# Patient Record
Sex: Female | Born: 1965 | Hispanic: No | Smoking: Never smoker
Health system: Southern US, Community
[De-identification: ages and names within clinical notes are randomized; demographics above are authoritative.]

## PROBLEM LIST (undated history)

## (undated) DIAGNOSIS — I7301 Raynaud's syndrome with gangrene: Secondary | ICD-10-CM

## (undated) HISTORY — DX: Raynaud's syndrome with gangrene: I73.01

## (undated) HISTORY — PX: APPENDECTOMY: SHX54

---

## 1998-10-30 HISTORY — PX: CERVICAL BIOPSY  W/ LOOP ELECTRODE EXCISION: SUR135

## 2005-12-01 ENCOUNTER — Emergency Department (HOSPITAL_COMMUNITY): Admission: EM | Admit: 2005-12-01 | Discharge: 2005-12-01 | Payer: Self-pay | Admitting: Emergency Medicine

## 2005-12-05 ENCOUNTER — Other Ambulatory Visit: Admission: RE | Admit: 2005-12-05 | Discharge: 2005-12-05 | Payer: Self-pay | Admitting: Obstetrics & Gynecology

## 2006-02-14 ENCOUNTER — Encounter: Admission: RE | Admit: 2006-02-14 | Discharge: 2006-02-14 | Payer: Self-pay | Admitting: *Deleted

## 2007-11-15 ENCOUNTER — Encounter (INDEPENDENT_AMBULATORY_CARE_PROVIDER_SITE_OTHER): Payer: Self-pay | Admitting: *Deleted

## 2007-11-15 ENCOUNTER — Ambulatory Visit (HOSPITAL_COMMUNITY): Admission: RE | Admit: 2007-11-15 | Discharge: 2007-11-15 | Payer: Self-pay | Admitting: *Deleted

## 2008-03-27 ENCOUNTER — Encounter: Admission: RE | Admit: 2008-03-27 | Discharge: 2008-03-27 | Payer: Self-pay | Admitting: Obstetrics & Gynecology

## 2010-11-20 ENCOUNTER — Encounter: Payer: Self-pay | Admitting: Otolaryngology

## 2011-03-14 NOTE — Op Note (Signed)
Morgan Flynn, Morgan Flynn        ACCOUNT NO.:  [id number]   MEDICAL RECORD NO.:  [id number]          PATIENT TYPE:  AMB   LOCATION:  ENDO                         FACILITY:  [facility]   PHYSICIAN:  Georgiana Spinner, M.D.    DATE OF BIRTH:  05/19/1966   DATE OF PROCEDURE:  11/15/2007  DATE OF DISCHARGE:                               OPERATIVE REPORT   PROCEDURE:  Upper endoscopy.   INDICATIONS:  Abdominal pain.   ANESTHESIA:  Fentanyl 50 mcg.   PROCEDURE IN DETAIL:  With the patient mildly sedated in the left  lateral decubitus position the Pentax videoscopic endoscope was inserted  in the mouth, passed under direct vision through the esophagus which  appeared normal above a small hiatal hernia.  There was no evidence of  Barrett's esophagus or esophagitis noted and photographs were taken.  We  entered into the stomach and a diffuse erythema was seen starting in the  cardia of the stomach fundus down to the body of the stomach where there  was a clear demarcation from the antrum which appeared normal.  Duodenal  bulb and second portion of duodenum were visualized and appeared normal  as well.  From this point the endoscope was slowly withdrawn taking  circumferential views of duodenal mucosa until the endoscope had been  pulled back and the stomach was placed in retroflexion to view the  stomach from below.  The endoscope was straightened and withdrawn taking  circumferential views of the remaining gastric and esophageal mucosa  stopping to biopsy from the body and fundus of the stomach, diffuse  erythematous changes were noted.  The patient's vital signs, pulse  oximeter remained stable.  The patient tolerated procedure well without  apparent complication.   FINDINGS:  Diffuse gastritis involving body, fundus and cardia of the  stomach with normal antrum, esophagus and duodenum.   PLAN:  Await biopsy report.  The patient will call me for results and  follow up with me as needed as an  outpatient.           ______________________________  Georgiana Spinner, M.D.     GMO/MEDQ  D:  11/15/2007  T:  11/15/2007  Job:  [id number]   cc:   Caryn Bee L. Little, M.D.  Fax: [phone]

## 2011-03-31 ENCOUNTER — Other Ambulatory Visit: Payer: Self-pay | Admitting: Obstetrics & Gynecology

## 2012-02-12 ENCOUNTER — Other Ambulatory Visit: Payer: Self-pay | Admitting: Obstetrics & Gynecology

## 2012-02-12 DIAGNOSIS — N6002 Solitary cyst of left breast: Secondary | ICD-10-CM

## 2012-02-20 ENCOUNTER — Other Ambulatory Visit: Payer: Self-pay

## 2012-02-27 ENCOUNTER — Other Ambulatory Visit: Payer: Self-pay

## 2012-03-15 ENCOUNTER — Ambulatory Visit
Admission: RE | Admit: 2012-03-15 | Discharge: 2012-03-15 | Disposition: A | Discharge status: Home / Self Care | Payer: BC Managed Care – PPO | Source: Ambulatory Visit | Attending: Obstetrics & Gynecology | Admitting: Obstetrics & Gynecology

## 2012-03-15 DIAGNOSIS — N6002 Solitary cyst of left breast: Secondary | ICD-10-CM

## 2012-03-29 ENCOUNTER — Other Ambulatory Visit: Payer: Self-pay

## 2012-04-29 ENCOUNTER — Other Ambulatory Visit: Payer: Self-pay

## 2013-06-17 ENCOUNTER — Other Ambulatory Visit: Payer: Self-pay | Admitting: Gastroenterology

## 2013-06-17 DIAGNOSIS — R1011 Right upper quadrant pain: Secondary | ICD-10-CM

## 2013-06-23 ENCOUNTER — Other Ambulatory Visit: Payer: BC Managed Care – PPO

## 2013-07-02 ENCOUNTER — Ambulatory Visit
Admission: RE | Admit: 2013-07-02 | Discharge: 2013-07-02 | Disposition: A | Discharge status: Home / Self Care | Payer: BC Managed Care – PPO | Source: Ambulatory Visit | Attending: Gastroenterology | Admitting: Gastroenterology

## 2013-07-02 DIAGNOSIS — R1011 Right upper quadrant pain: Secondary | ICD-10-CM

## 2015-04-15 ENCOUNTER — Other Ambulatory Visit: Payer: Self-pay

## 2015-04-16 LAB — CYTOLOGY - PAP

## 2016-03-17 DIAGNOSIS — R079 Chest pain, unspecified: Secondary | ICD-10-CM | POA: Diagnosis not present

## 2016-03-17 DIAGNOSIS — R609 Edema, unspecified: Secondary | ICD-10-CM | POA: Diagnosis not present

## 2016-04-07 DIAGNOSIS — R079 Chest pain, unspecified: Secondary | ICD-10-CM | POA: Diagnosis not present

## 2016-04-07 DIAGNOSIS — M79643 Pain in unspecified hand: Secondary | ICD-10-CM | POA: Diagnosis not present

## 2016-04-07 DIAGNOSIS — M7989 Other specified soft tissue disorders: Secondary | ICD-10-CM | POA: Diagnosis not present

## 2016-04-07 DIAGNOSIS — D71 Functional disorders of polymorphonuclear neutrophils: Secondary | ICD-10-CM | POA: Diagnosis not present

## 2016-04-07 DIAGNOSIS — R0789 Other chest pain: Secondary | ICD-10-CM | POA: Diagnosis not present

## 2016-04-07 DIAGNOSIS — R0602 Shortness of breath: Secondary | ICD-10-CM | POA: Diagnosis not present

## 2016-04-13 DIAGNOSIS — R0789 Other chest pain: Secondary | ICD-10-CM | POA: Diagnosis not present

## 2016-04-13 DIAGNOSIS — M79643 Pain in unspecified hand: Secondary | ICD-10-CM | POA: Diagnosis not present

## 2016-04-13 DIAGNOSIS — M7989 Other specified soft tissue disorders: Secondary | ICD-10-CM | POA: Diagnosis not present

## 2016-04-26 DIAGNOSIS — Z01419 Encounter for gynecological examination (general) (routine) without abnormal findings: Secondary | ICD-10-CM | POA: Diagnosis not present

## 2016-04-26 DIAGNOSIS — Z6821 Body mass index (BMI) 21.0-21.9, adult: Secondary | ICD-10-CM | POA: Diagnosis not present

## 2016-04-26 DIAGNOSIS — Z1231 Encounter for screening mammogram for malignant neoplasm of breast: Secondary | ICD-10-CM | POA: Diagnosis not present

## 2016-04-28 DIAGNOSIS — R0789 Other chest pain: Secondary | ICD-10-CM | POA: Diagnosis not present

## 2016-05-04 ENCOUNTER — Other Ambulatory Visit: Payer: Self-pay | Admitting: Obstetrics & Gynecology

## 2016-05-04 DIAGNOSIS — R928 Other abnormal and inconclusive findings on diagnostic imaging of breast: Secondary | ICD-10-CM

## 2016-05-05 DIAGNOSIS — R0602 Shortness of breath: Secondary | ICD-10-CM | POA: Diagnosis not present

## 2016-05-05 DIAGNOSIS — R0789 Other chest pain: Secondary | ICD-10-CM | POA: Diagnosis not present

## 2016-05-05 DIAGNOSIS — M7989 Other specified soft tissue disorders: Secondary | ICD-10-CM | POA: Diagnosis not present

## 2016-05-05 DIAGNOSIS — M79643 Pain in unspecified hand: Secondary | ICD-10-CM | POA: Diagnosis not present

## 2016-05-09 ENCOUNTER — Ambulatory Visit
Admission: RE | Admit: 2016-05-09 | Discharge: 2016-05-09 | Disposition: A | Discharge status: Home / Self Care | Payer: BLUE CROSS/BLUE SHIELD | Source: Ambulatory Visit | Attending: Obstetrics & Gynecology | Admitting: Obstetrics & Gynecology

## 2016-05-09 DIAGNOSIS — R922 Inconclusive mammogram: Secondary | ICD-10-CM | POA: Diagnosis not present

## 2016-05-09 DIAGNOSIS — R928 Other abnormal and inconclusive findings on diagnostic imaging of breast: Secondary | ICD-10-CM

## 2016-06-13 DIAGNOSIS — I7300 Raynaud's syndrome without gangrene: Secondary | ICD-10-CM | POA: Diagnosis not present

## 2016-06-13 DIAGNOSIS — M79641 Pain in right hand: Secondary | ICD-10-CM | POA: Diagnosis not present

## 2016-06-13 DIAGNOSIS — M25561 Pain in right knee: Secondary | ICD-10-CM | POA: Diagnosis not present

## 2016-06-13 DIAGNOSIS — R5381 Other malaise: Secondary | ICD-10-CM | POA: Diagnosis not present

## 2016-06-13 DIAGNOSIS — R300 Dysuria: Secondary | ICD-10-CM | POA: Diagnosis not present

## 2016-06-13 DIAGNOSIS — M79642 Pain in left hand: Secondary | ICD-10-CM | POA: Diagnosis not present

## 2016-06-13 DIAGNOSIS — M2550 Pain in unspecified joint: Secondary | ICD-10-CM | POA: Diagnosis not present

## 2016-06-13 DIAGNOSIS — Z79899 Other long term (current) drug therapy: Secondary | ICD-10-CM | POA: Diagnosis not present

## 2016-07-27 DIAGNOSIS — M3509 Sicca syndrome with other organ involvement: Secondary | ICD-10-CM | POA: Diagnosis not present

## 2016-07-27 DIAGNOSIS — I7300 Raynaud's syndrome without gangrene: Secondary | ICD-10-CM | POA: Diagnosis not present

## 2016-07-27 DIAGNOSIS — M19241 Secondary osteoarthritis, right hand: Secondary | ICD-10-CM | POA: Diagnosis not present

## 2016-07-27 DIAGNOSIS — M170 Bilateral primary osteoarthritis of knee: Secondary | ICD-10-CM | POA: Diagnosis not present

## 2016-10-27 DIAGNOSIS — Z1211 Encounter for screening for malignant neoplasm of colon: Secondary | ICD-10-CM | POA: Diagnosis not present

## 2016-10-27 DIAGNOSIS — K640 First degree hemorrhoids: Secondary | ICD-10-CM | POA: Diagnosis not present

## 2016-12-15 DIAGNOSIS — Z803 Family history of malignant neoplasm of breast: Secondary | ICD-10-CM | POA: Diagnosis not present

## 2016-12-15 DIAGNOSIS — R7301 Impaired fasting glucose: Secondary | ICD-10-CM | POA: Diagnosis not present

## 2016-12-15 DIAGNOSIS — Z0000 Encounter for general adult medical examination without abnormal findings: Secondary | ICD-10-CM | POA: Diagnosis not present

## 2016-12-15 DIAGNOSIS — J309 Allergic rhinitis, unspecified: Secondary | ICD-10-CM | POA: Diagnosis not present

## 2016-12-22 DIAGNOSIS — Z0000 Encounter for general adult medical examination without abnormal findings: Secondary | ICD-10-CM | POA: Diagnosis not present

## 2017-05-23 DIAGNOSIS — Z3202 Encounter for pregnancy test, result negative: Secondary | ICD-10-CM | POA: Diagnosis not present

## 2017-05-23 DIAGNOSIS — Z01419 Encounter for gynecological examination (general) (routine) without abnormal findings: Secondary | ICD-10-CM | POA: Diagnosis not present

## 2017-05-23 DIAGNOSIS — Z1231 Encounter for screening mammogram for malignant neoplasm of breast: Secondary | ICD-10-CM | POA: Diagnosis not present

## 2017-05-23 DIAGNOSIS — Z202 Contact with and (suspected) exposure to infections with a predominantly sexual mode of transmission: Secondary | ICD-10-CM | POA: Diagnosis not present

## 2017-05-23 DIAGNOSIS — Z6821 Body mass index (BMI) 21.0-21.9, adult: Secondary | ICD-10-CM | POA: Diagnosis not present

## 2017-09-06 DIAGNOSIS — B029 Zoster without complications: Secondary | ICD-10-CM | POA: Diagnosis not present

## 2017-09-06 DIAGNOSIS — B020 Zoster encephalitis: Secondary | ICD-10-CM | POA: Diagnosis not present

## 2017-09-13 DIAGNOSIS — R0781 Pleurodynia: Secondary | ICD-10-CM | POA: Diagnosis not present

## 2017-12-17 DIAGNOSIS — Z0000 Encounter for general adult medical examination without abnormal findings: Secondary | ICD-10-CM | POA: Diagnosis not present

## 2017-12-21 DIAGNOSIS — Z0000 Encounter for general adult medical examination without abnormal findings: Secondary | ICD-10-CM | POA: Diagnosis not present

## 2018-05-29 DIAGNOSIS — N912 Amenorrhea, unspecified: Secondary | ICD-10-CM | POA: Diagnosis not present

## 2018-05-29 DIAGNOSIS — Z6822 Body mass index (BMI) 22.0-22.9, adult: Secondary | ICD-10-CM | POA: Diagnosis not present

## 2018-05-29 DIAGNOSIS — Z01419 Encounter for gynecological examination (general) (routine) without abnormal findings: Secondary | ICD-10-CM | POA: Diagnosis not present

## 2018-05-29 DIAGNOSIS — Z124 Encounter for screening for malignant neoplasm of cervix: Secondary | ICD-10-CM | POA: Diagnosis not present

## 2018-05-29 DIAGNOSIS — N841 Polyp of cervix uteri: Secondary | ICD-10-CM | POA: Diagnosis not present

## 2018-06-07 DIAGNOSIS — Z6822 Body mass index (BMI) 22.0-22.9, adult: Secondary | ICD-10-CM | POA: Diagnosis not present

## 2018-06-07 DIAGNOSIS — Z1231 Encounter for screening mammogram for malignant neoplasm of breast: Secondary | ICD-10-CM | POA: Diagnosis not present

## 2018-12-16 DIAGNOSIS — J0100 Acute maxillary sinusitis, unspecified: Secondary | ICD-10-CM | POA: Diagnosis not present

## 2018-12-25 DIAGNOSIS — Z0000 Encounter for general adult medical examination without abnormal findings: Secondary | ICD-10-CM | POA: Diagnosis not present

## 2018-12-25 DIAGNOSIS — R7301 Impaired fasting glucose: Secondary | ICD-10-CM | POA: Diagnosis not present

## 2018-12-25 DIAGNOSIS — Z803 Family history of malignant neoplasm of breast: Secondary | ICD-10-CM | POA: Diagnosis not present

## 2018-12-25 DIAGNOSIS — J309 Allergic rhinitis, unspecified: Secondary | ICD-10-CM | POA: Diagnosis not present

## 2018-12-27 DIAGNOSIS — Z0000 Encounter for general adult medical examination without abnormal findings: Secondary | ICD-10-CM | POA: Diagnosis not present

## 2018-12-27 DIAGNOSIS — R7301 Impaired fasting glucose: Secondary | ICD-10-CM | POA: Diagnosis not present

## 2019-04-09 DIAGNOSIS — D2261 Melanocytic nevi of right upper limb, including shoulder: Secondary | ICD-10-CM | POA: Diagnosis not present

## 2019-04-09 DIAGNOSIS — D235 Other benign neoplasm of skin of trunk: Secondary | ICD-10-CM | POA: Diagnosis not present

## 2019-04-09 DIAGNOSIS — D225 Melanocytic nevi of trunk: Secondary | ICD-10-CM | POA: Diagnosis not present

## 2019-04-09 DIAGNOSIS — D2262 Melanocytic nevi of left upper limb, including shoulder: Secondary | ICD-10-CM | POA: Diagnosis not present

## 2019-05-06 ENCOUNTER — Other Ambulatory Visit: Payer: Self-pay | Admitting: Obstetrics and Gynecology

## 2019-05-06 DIAGNOSIS — N644 Mastodynia: Secondary | ICD-10-CM

## 2019-06-02 ENCOUNTER — Other Ambulatory Visit: Payer: Self-pay

## 2019-06-02 ENCOUNTER — Ambulatory Visit
Admission: RE | Admit: 2019-06-02 | Discharge: 2019-06-02 | Disposition: A | Discharge status: Home / Self Care | Payer: Self-pay | Source: Ambulatory Visit | Attending: Obstetrics and Gynecology | Admitting: Obstetrics and Gynecology

## 2019-06-02 ENCOUNTER — Ambulatory Visit
Admission: RE | Admit: 2019-06-02 | Discharge: 2019-06-02 | Disposition: A | Discharge status: Home / Self Care | Payer: BLUE CROSS/BLUE SHIELD | Source: Ambulatory Visit | Attending: Obstetrics and Gynecology | Admitting: Obstetrics and Gynecology

## 2019-06-02 DIAGNOSIS — N644 Mastodynia: Secondary | ICD-10-CM

## 2019-06-02 DIAGNOSIS — R922 Inconclusive mammogram: Secondary | ICD-10-CM | POA: Diagnosis not present

## 2019-06-09 DIAGNOSIS — Z01419 Encounter for gynecological examination (general) (routine) without abnormal findings: Secondary | ICD-10-CM | POA: Diagnosis not present

## 2019-06-09 DIAGNOSIS — Z6822 Body mass index (BMI) 22.0-22.9, adult: Secondary | ICD-10-CM | POA: Diagnosis not present

## 2019-06-30 NOTE — Progress Notes (Signed)
Office Visit Note  Patient: Morgan Flynn             Date of Birth: 12/22/1965           MRN: [id number]             PCP: Catha GosselinLittle, Kevin, MD Referring: No ref. provider found Visit Date: 07/09/2019 Occupation: @GUAROCC @  Subjective:  Joint stiffness and Raynauds.    History of Present Illness: Morgan ShipMargarita Macpherson is a 53 y.o. female returns today after her last visit in September 2017 at that time she was seen for evaluation of Raynauds and sicca symptoms.  According to patient she went for a viral tumor and came back and her symptoms exacerbated.  She had a history of Raynaud's phenomenon for many years.  She states recently she has been experiencing puffiness on her face when she gets up in the morning.  She is also noticed swelling in her legs.  She denies any joint pain but she has a stiffness in her hands, knees and her feet.  She also has some lower back pain.  Her sicca symptoms are not as severe.  She denies any history of rash.  Patient states that 25 years ago while she was pregnant she developed swelling in her right foot.  Since then she has persistent swelling in her right foot and her right knee.  She states her symptoms get worse during the summertime.  She believes she has lymphedema in her right lower extremity.  Patient states that she has had x-rays of her knees and feet recently and they were consistent with some arthritis.  Activities of Daily Living:  Patient reports morning stiffness for 15 minutes.   Patient Denies nocturnal pain.  Difficulty dressing/grooming: Denies Difficulty climbing stairs: Denies Difficulty getting out of chair: Denies Difficulty using hands for taps, buttons, cutlery, and/or writing: Denies  Review of Systems  Constitutional: Negative for fatigue, night sweats, weight gain and weight loss.  HENT: Negative for mouth sores, trouble swallowing, trouble swallowing, mouth dryness and nose dryness.   Eyes: Negative for pain, redness,  itching, visual disturbance and dryness.  Respiratory: Negative for cough, shortness of breath, wheezing and difficulty breathing.   Cardiovascular: Negative for chest pain, palpitations, hypertension, irregular heartbeat and swelling in legs/feet.  Gastrointestinal: Negative for blood in stool, constipation and diarrhea.  Endocrine: Negative for increased urination.  Genitourinary: Negative for difficulty urinating, painful urination and vaginal dryness.  Musculoskeletal: Positive for joint swelling and morning stiffness. Negative for arthralgias, joint pain, myalgias, muscle weakness, muscle tenderness and myalgias.  Skin: Positive for color change. Negative for rash, hair loss, skin tightness, ulcers and sensitivity to sunlight.  Allergic/Immunologic: Negative for susceptible to infections.  Neurological: Negative for dizziness, headaches, memory loss, night sweats and weakness.  Hematological: Negative for swollen glands.  Psychiatric/Behavioral: Negative for depressed mood, confusion and sleep disturbance. The patient is not nervous/anxious.     PMFS History:  Patient Active Problem List   Diagnosis Date Noted   Raynaud's syndrome without gangrene 07/09/2019   Primary osteoarthritis of both knees 07/09/2019    History reviewed. No pertinent past medical history.  Family History  Problem Relation Age of Onset   Breast cancer Sister    Ovarian cancer Sister    Heart Problems Mother    Healthy Son    Past Surgical History:  Procedure Laterality Date   APPENDECTOMY     age 53   CERVICAL BIOPSY  W/ LOOP ELECTRODE EXCISION  2000  Social History   Social History Narrative   Not on file    There is no immunization history on file for this patient.   Objective: Vital Signs: BP 114/76 (BP Location: Left Arm, Patient Position: Sitting, Cuff Size: Normal)    Pulse 67    Resp 12    Ht 5\' 5"  (1.651 m)    Wt 137 lb 9.6 oz (62.4 kg)    BMI 22.90 kg/m    Physical  Exam Vitals signs and nursing note reviewed.  Constitutional:      Appearance: She is well-developed.  HENT:     Head: Normocephalic and atraumatic.  Eyes:     Conjunctiva/sclera: Conjunctivae normal.  Neck:     Musculoskeletal: Normal range of motion.  Cardiovascular:     Rate and Rhythm: Normal rate and regular rhythm.     Heart sounds: Normal heart sounds.  Pulmonary:     Effort: Pulmonary effort is normal.     Breath sounds: Normal breath sounds.  Abdominal:     General: Bowel sounds are normal.     Palpations: Abdomen is soft.  Lymphadenopathy:     Cervical: No cervical adenopathy.  Skin:    General: Skin is warm and dry.     Capillary Refill: Capillary refill takes less than 2 seconds.  Neurological:     Mental Status: She is alert and oriented to person, place, and time.  Psychiatric:        Behavior: Behavior normal.      Musculoskeletal Exam: She has some stiffness of range of motion of her cervical spine.  Shoulder joints elbow joints wrist joint MCPs PIPs DIPs with good range of motion.  She has mild thickening of her bilateral first DIP joint.  She has good range of motion of her bilateral knee joints ankle joints hip joints.  Thickening of her right second toe was noted.  She states is been like this for many years.  Nonpitting edema was noted on the dorsum of her right foot and some on her right lower extremity.  CDAI Exam: CDAI Score: -- Patient Global: --; Provider Global: -- Swollen: --; Tender: -- Joint Exam   No joint exam has been documented for this visit   There is currently no information documented on the homunculus. Go to the Rheumatology activity and complete the homunculus joint exam.  Investigation: No additional findings.  Imaging: No results found.August 2017:  Her labs showed CBC, comprehensive metabolic panel, sed rate, CK, UA were normal.  Her PAN ANCA was negative.  C-ANCA was slightly positive at 1:40.  ANA was negative.  Complements  normal.  ENA showed positive La antibody.  Beta-2 anticardiolipin and lupus anticoagulant were negative.  CCP was negative and 14-3-3 eta was negative.  Cryoglobulins were negative and SPEP was normal.   Recent Labs: No results found for: WBC, HGB, PLT, NA, K, CL, CO2, GLUCOSE, BUN, CREATININE, BILITOT, ALKPHOS, AST, ALT, PROT, ALBUMIN, CALCIUM, GFRAA, QFTBGOLD, QFTBGOLDPLUS  Speciality Comments: No specialty comments available.  Procedures:  No procedures performed Allergies: Patient has no known allergies.   Assessment / Plan:     Visit Diagnoses: Raynaud's syndrome without gangrene -her Raynolds symptoms are more prominent during the wintertime.  She states she is not having much discomfort from Raynaud's.  Her labs in the past showed c-ANCA 1:40,  ENA showed +La antibody -per her request I will repeat ANCA levels.  Plan: Pan-ANCA  Primary osteoarthritis of both knees-I do not have x-rays available  of her knee joints.  According to patient her x-rays show osteoarthritis and she has ongoing stiffness and discomfort in her knee joints.  Sicca, unspecified type ([place]) - positive La antibody.   -Patient states that her sicca symptoms have improved over time.  She is currently not having much symptoms.  Although she would like to have repeat antibody testing.  Plan: Sjogrens syndrome-B extractable nuclear antibody, Sjogrens syndrome-A extractable nuclear antibody  Joint stiffness -she has been experiencing increasing stiffness in multiple joints which include her bilateral hands, bilateral knee joints and her feet.  To complete the work-up I will obtain following labs today.  I did not see any synovitis in her joints.  Her right second toe is thickened but no swelling or synovitis was noted.  She believes is been like this for 25 years.  Plan: Sedimentation rate, Rheumatoid factor, Cyclic citrul peptide antibody, IgG, 14-3-3 eta Protein, ANA, Uric acid  Other fatigue - Plan: CBC with  Differential/Platelet, COMPLETE METABOLIC PANEL WITH GFR  Lymphedema of right lower extremity-she has had nonpitting edema on her right lower extremity.  She states she has had work-up many years ago.  She believes her symptoms are started while she was pregnant 25 years ago.  She has not noticed any improvement in the symptoms.  Although she would like further evaluation.  I will refer her to vascular surgery.  History of hypertension-her blood pressure is well controlled.  Orders: Orders Placed This Encounter  Procedures   CBC with Differential/Platelet   COMPLETE METABOLIC PANEL WITH GFR   Sedimentation rate   Rheumatoid factor   Cyclic citrul peptide antibody, IgG   14-3-3 eta Protein   ANA   Sjogrens syndrome-B extractable nuclear antibody   Sjogrens syndrome-A extractable nuclear antibody   Pan-ANCA   Uric acid   Ambulatory referral to Vascular Surgery   No orders of the defined types were placed in this encounter.   Face-to-face time spent with patient was 40 minutes. Greater than 50% of time was spent in counseling and coordination of care.  Follow-Up Instructions: Return in about 1 month (around 08/08/2019) for Osteoarthritis, raynauds.   Bo Merino, MD  Note - This record has been created using Editor, commissioning.  Chart creation errors have been sought, but may not always  have been located. Such creation errors do not reflect on  the standard of medical care.

## 2019-07-09 ENCOUNTER — Encounter: Payer: Self-pay | Admitting: Rheumatology

## 2019-07-09 ENCOUNTER — Ambulatory Visit: Payer: Self-pay | Admitting: Rheumatology

## 2019-07-09 ENCOUNTER — Other Ambulatory Visit: Payer: Self-pay

## 2019-07-09 VITALS — BP 114/76 | HR 67 | Resp 12 | Ht 65.0 in | Wt 137.6 lb

## 2019-07-09 DIAGNOSIS — M3500 Sicca syndrome, unspecified: Secondary | ICD-10-CM | POA: Diagnosis not present

## 2019-07-09 DIAGNOSIS — M2560 Stiffness of unspecified joint, not elsewhere classified: Secondary | ICD-10-CM

## 2019-07-09 DIAGNOSIS — I7300 Raynaud's syndrome without gangrene: Secondary | ICD-10-CM | POA: Diagnosis not present

## 2019-07-09 DIAGNOSIS — R5383 Other fatigue: Secondary | ICD-10-CM | POA: Diagnosis not present

## 2019-07-09 DIAGNOSIS — I890 Lymphedema, not elsewhere classified: Secondary | ICD-10-CM

## 2019-07-09 DIAGNOSIS — Z8679 Personal history of other diseases of the circulatory system: Secondary | ICD-10-CM

## 2019-07-09 DIAGNOSIS — M170 Bilateral primary osteoarthritis of knee: Secondary | ICD-10-CM | POA: Diagnosis not present

## 2019-07-14 LAB — CBC WITH DIFFERENTIAL/PLATELET
Absolute Monocytes: 374 cells/uL (ref 200–950)
Basophils Absolute: 29 cells/uL (ref 0–200)
Basophils Relative: 0.7 %
Eosinophils Absolute: 42 cells/uL (ref 15–500)
Eosinophils Relative: 1 %
HCT: 40.4 % (ref 35.0–45.0)
Hemoglobin: 13.2 g/dL (ref 11.7–15.5)
Lymphs Abs: 1394 cells/uL (ref 850–3900)
MCH: 31.1 pg (ref 27.0–33.0)
MCHC: 32.7 g/dL (ref 32.0–36.0)
MCV: 95.3 fL (ref 80.0–100.0)
MPV: 11.5 fL (ref 7.5–12.5)
Monocytes Relative: 8.9 %
Neutro Abs: 2360 cells/uL (ref 1500–7800)
Neutrophils Relative %: 56.2 %
Platelets: 194 10*3/uL (ref 140–400)
RBC: 4.24 10*6/uL (ref 3.80–5.10)
RDW: 12.1 % (ref 11.0–15.0)
Total Lymphocyte: 33.2 %
WBC: 4.2 10*3/uL (ref 3.8–10.8)

## 2019-07-14 LAB — COMPLETE METABOLIC PANEL WITH GFR
AG Ratio: 1.8 (calc) (ref 1.0–2.5)
ALT: 13 U/L (ref 6–29)
AST: 18 U/L (ref 10–35)
Albumin: 4.6 g/dL (ref 3.6–5.1)
Alkaline phosphatase (APISO): 59 U/L (ref 37–153)
BUN: 13 mg/dL (ref 7–25)
CO2: 32 mmol/L (ref 20–32)
Calcium: 9.6 mg/dL (ref 8.6–10.4)
Chloride: 104 mmol/L (ref 98–110)
Creat: 0.64 mg/dL (ref 0.50–1.05)
GFR, Est African American: 117 mL/min/{1.73_m2} (ref >=60)
GFR, Est Non African American: 101 mL/min/{1.73_m2} (ref >=60)
Globulin: 2.5 g/dL (calc) (ref 1.9–3.7)
Glucose, Bld: 96 mg/dL (ref 65–99)
Potassium: 4.1 mmol/L (ref 3.5–5.3)
Sodium: 141 mmol/L (ref 135–146)
Total Bilirubin: 0.5 mg/dL (ref 0.2–1.2)
Total Protein: 7.1 g/dL (ref 6.1–8.1)

## 2019-07-14 LAB — URIC ACID: Uric Acid, Serum: 3.8 mg/dL (ref 2.5–7.0)

## 2019-07-14 LAB — ANA: Anti Nuclear Antibody (ANA): NEGATIVE

## 2019-07-14 LAB — RHEUMATOID FACTOR: Rhuematoid fact SerPl-aCnc: <14 IU/mL (ref <14)

## 2019-07-14 LAB — CYCLIC CITRUL PEPTIDE ANTIBODY, IGG: Cyclic Citrullin Peptide Ab: <16 UNITS

## 2019-07-14 LAB — PAN-ANCA
ANCA Screen: NEGATIVE
Myeloperoxidase Abs: <1 AI
Serine Protease 3: <1 AI

## 2019-07-14 LAB — SJOGRENS SYNDROME-A EXTRACTABLE NUCLEAR ANTIBODY: SSA (Ro) (ENA) Antibody, IgG: <1 AI

## 2019-07-14 LAB — SJOGRENS SYNDROME-B EXTRACTABLE NUCLEAR ANTIBODY: SSB (La) (ENA) Antibody, IgG: 2.2 AI — AB

## 2019-07-14 LAB — 14-3-3 ETA PROTEIN: 14-3-3 eta Protein: <0.2 ng/mL (ref <0.2)

## 2019-07-14 LAB — SEDIMENTATION RATE: Sed Rate: 2 mm/h (ref 0–30)

## 2019-07-14 NOTE — Progress Notes (Signed)
We will discuss results at the follow-up visit.

## 2019-07-16 ENCOUNTER — Ambulatory Visit: Payer: Self-pay | Admitting: Rheumatology

## 2019-07-24 NOTE — Progress Notes (Deleted)
   Office Visit Note  Patient: Morgan Flynn             Date of Birth: 1966-01-02           MRN: [id number]             PCP: Hulan Fess, MD Referring: Hulan Fess, MD Visit Date: 08/07/2019 Occupation: @GUAROCC @  Subjective:  No chief complaint on file.   History of Present Illness: Morgan Flynn is a 53 y.o. female ***   Activities of Daily Living:  Patient reports morning stiffness for *** {minute/hour:19697}.   Patient {ACTIONS;DENIES/REPORTS:21021675::"Denies"} nocturnal pain.  Difficulty dressing/grooming: {ACTIONS;DENIES/REPORTS:21021675::"Denies"} Difficulty climbing stairs: {ACTIONS;DENIES/REPORTS:21021675::"Denies"} Difficulty getting out of chair: {ACTIONS;DENIES/REPORTS:21021675::"Denies"} Difficulty using hands for taps, buttons, cutlery, and/or writing: {ACTIONS;DENIES/REPORTS:21021675::"Denies"}  No Rheumatology ROS completed.   PMFS History:  Patient Active Problem List   Diagnosis Date Noted  . Raynaud's syndrome without gangrene 07/09/2019  . Primary osteoarthritis of both knees 07/09/2019    No past medical history on file.  Family History  Problem Relation Age of Onset  . Breast cancer Sister   . Ovarian cancer Sister   . Heart Problems Mother   . Healthy Son    Past Surgical History:  Procedure Laterality Date  . APPENDECTOMY     age 67  . CERVICAL BIOPSY  W/ LOOP ELECTRODE EXCISION  2000   Social History   Social History Narrative  . Not on file    There is no immunization history on file for this patient.   Objective: Vital Signs: There were no vitals taken for this visit.   Physical Exam   Musculoskeletal Exam: ***  CDAI Exam: CDAI Score: - Patient Global: -; Provider Global: - Swollen: -; Tender: - Joint Exam   No joint exam has been documented for this visit   There is currently no information documented on the homunculus. Go to the Rheumatology activity and complete the homunculus joint exam.   Investigation: No additional findings.  Imaging: No results found.  Recent Labs: Lab Results  Component Value Date   WBC 4.2 07/09/2019   HGB 13.2 07/09/2019   PLT 194 07/09/2019   NA 141 07/09/2019   K 4.1 07/09/2019   CL 104 07/09/2019   CO2 32 07/09/2019   GLUCOSE 96 07/09/2019   BUN 13 07/09/2019   CREATININE 0.64 07/09/2019   BILITOT 0.5 07/09/2019   AST 18 07/09/2019   ALT 13 07/09/2019   PROT 7.1 07/09/2019   CALCIUM 9.6 07/09/2019   GFRAA 117 07/09/2019    Speciality Comments: No specialty comments available.  Procedures:  No procedures performed Allergies: Patient has no known allergies.   Assessment / Plan:     Visit Diagnoses: No diagnosis found.  Orders: No orders of the defined types were placed in this encounter.  No orders of the defined types were placed in this encounter.   Face-to-face time spent with patient was *** minutes. Greater than 50% of time was spent in counseling and coordination of care.  Follow-Up Instructions: No follow-ups on file.   Earnestine Mealing, CMA  Note - This record has been created using Editor, commissioning.  Chart creation errors have been sought, but may not always  have been located. Such creation errors do not reflect on  the standard of medical care.

## 2019-07-24 NOTE — Progress Notes (Signed)
Office Visit Note  Patient: Morgan Flynn             Date of Birth: 1965/12/05           MRN: [id number]             PCP: Hulan Fess, MD Referring: Hulan Fess, MD Visit Date: 08/07/2019 Occupation: '@GUAROCC' @  Subjective:  Pain in both knee joints   History of Present Illness: Morgan Flynn is a 53 y.o. female with history of Raynaud's, sicca symptoms, and osteoarthritis. She reports she has intermittent pain and swelling in both knee joints and both wrist joints.  She states the swelling in worse in the mornings.  She has chronic neck pain and stiffness.  She has been performing neck exercises on a regular basis.  She also uses a Production assistant, radio but has persistent discomfort. 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  She states has mild mouth dryness but no eye dryness.  She tries to drink a lot of fluids on a daily basis.     Activities of Daily Living:  Patient reports morning stiffness for 20 minutes.   Patient Denies nocturnal pain.  Difficulty dressing/grooming: Denies Difficulty climbing stairs: Denies Difficulty getting out of chair: Denies Difficulty using hands for taps, buttons, cutlery, and/or writing: Reports  Review of Systems  Constitutional: Negative for fatigue.  HENT: Negative for mouth sores, mouth dryness and nose dryness.   Eyes: Negative for pain, itching, visual disturbance and dryness.  Respiratory: Negative for cough, hemoptysis, shortness of breath, wheezing and difficulty breathing.   Cardiovascular: Negative for chest pain, palpitations, hypertension and swelling in legs/feet.  Gastrointestinal: Negative for abdominal pain, blood in stool, constipation and diarrhea.  Endocrine: Negative for increased urination.  Genitourinary: Negative for difficulty urinating and painful urination.  Musculoskeletal: Positive for arthralgias, joint pain and morning stiffness. Negative for joint swelling, myalgias, muscle weakness, muscle tenderness  and myalgias.  Skin: Positive for color change. Negative for pallor, rash, hair loss, nodules/bumps, skin tightness, ulcers and sensitivity to sunlight.  Allergic/Immunologic: Negative for susceptible to infections.  Neurological: Negative for dizziness, light-headedness, numbness, headaches and memory loss.  Hematological: Negative for swollen glands.  Psychiatric/Behavioral: Negative for depressed mood, confusion and sleep disturbance. The patient is not nervous/anxious.     PMFS History:  Patient Active Problem List   Diagnosis Date Noted  . Raynaud's syndrome without gangrene 07/09/2019  . Primary osteoarthritis of both knees 07/09/2019    History reviewed. No pertinent past medical history.  Family History  Problem Relation Age of Onset  . Breast cancer Sister   . Ovarian cancer Sister   . Heart Problems Mother   . Healthy Son    Past Surgical History:  Procedure Laterality Date  . APPENDECTOMY     age 12  . CERVICAL BIOPSY  W/ LOOP ELECTRODE EXCISION  2000   Social History   Social History Narrative  . Not on file    There is no immunization history on file for this patient.   Objective: Vital Signs: BP 117/81 (BP Location: Left Arm, Patient Position: Sitting, Cuff Size: Normal)   Pulse 60   Resp 11   Ht '5\' 5"'  (1.651 m)   Wt 137 lb (62.1 kg)   BMI 22.80 kg/m    Physical Exam Vitals signs and nursing note reviewed.  Constitutional:      Appearance: She is well-developed.  HENT:     Head: Normocephalic and atraumatic.     Comments:  No parotid swelling or tenderness Eyes:     Conjunctiva/sclera: Conjunctivae normal.  Neck:     Musculoskeletal: Normal range of motion.  Cardiovascular:     Rate and Rhythm: Normal rate and regular rhythm.     Heart sounds: Normal heart sounds.  Pulmonary:     Effort: Pulmonary effort is normal.     Breath sounds: Normal breath sounds.  Abdominal:     General: Bowel sounds are normal.     Palpations: Abdomen is soft.   Lymphadenopathy:     Cervical: No cervical adenopathy.  Skin:    General: Skin is warm and dry.     Capillary Refill: Capillary refill takes less than 2 seconds.  Neurological:     Mental Status: She is alert and oriented to person, place, and time.  Psychiatric:        Behavior: Behavior normal.      Musculoskeletal Exam: C-spine limited ROM with discomfort.  Trapezius muscle tension and tenderness.  Thoracic and lumbar spine good ROM.  Shoulder joints, elbow joints, wrist joints, MCPs, PIPs, and DIPs good ROM with no synovitis.  Complete fist formation bilaterally.  Hip joints, knee joints, ankle joints, MTPs, PIPs, and DIPs good ROM with no synovitis.  No warmth or effusion of knee joints.  No tenderness or swelling of ankle joints.   CDAI Exam: CDAI Score: - Patient Global: -; Provider Global: - Swollen: -; Tender: - Joint Exam   No joint exam has been documented for this visit   There is currently no information documented on the homunculus. Go to the Rheumatology activity and complete the homunculus joint exam.  Investigation: No additional findings.  Imaging: No results found.  Recent Labs: Lab Results  Component Value Date   WBC 4.2 07/09/2019   HGB 13.2 07/09/2019   PLT 194 07/09/2019   NA 141 07/09/2019   K 4.1 07/09/2019   CL 104 07/09/2019   CO2 32 07/09/2019   GLUCOSE 96 07/09/2019   BUN 13 07/09/2019   CREATININE 0.64 07/09/2019   BILITOT 0.5 07/09/2019   AST 18 07/09/2019   ALT 13 07/09/2019   PROT 7.1 07/09/2019   CALCIUM 9.6 07/09/2019   GFRAA 117 07/09/2019  July 09, 2019 ANCA negative, MPO negative, serine proteinase 3-, ESR 2, ANA negative, SSA negative, SSB positive, RF negative, anti-CCP negative, '14 3 3 ' eta negative, uric acid 3.8  Speciality Comments: No specialty comments available.  Procedures:  No procedures performed Allergies: Patient has no known allergies.   Assessment / Plan:     Visit Diagnoses: Raynaud's syndrome without  gangrene -July 09, 2019 ANCA negative, MPO negative, serine proteinase 3-, ESR 2, ANA negative, SSA negative, SSB positive, RF negative, anti-CCP negative, '14 3 3 ' eta negative, uric acid 3.8: She has intermittent symptoms of Raynaud's.  No digital ulcerations or signs of gangrene were noted.  No capillary bed changes noted.  No signs of sclerodactyly.  Autoimmune lab work was discussed and all questions were addressed.  She was encouraged to keep her core body temperature warm and wear gloves and thick socks.  She will notify us if she develops any new or worsening symptoms.  She will follow-up in 1 year.  Sicca, unspecified type ([place]) - La+, Ro-: She has mild mouth dryness but it is manageable with drinking fluids frequently throughout the day.  We discussed OTC products that she can use for symptomatic relief.  She has no parotid swelling or tenderness on exam.  No cervical  lymphadenopathy.  She was advised to notify us if she develops any new or worsening symptoms.  Primary osteoarthritis of both knees: She has good ROM with no discomfort.  No warmth or effusion noted.  She reports intermittent swelling in both knee joints but no inflammation was noted on exam.  She declined a handout of knee joint exercises.  She was advised to notify us if she develops increased joint pain or joint swelling.    Joint stiffness - All autoimmune work-up was negative except positive SSB antibody.  ESR was normal.   Other medical conditions are listed as follows:   Lymphedema of right lower extremity  History of hypertension  Orders: No orders of the defined types were placed in this encounter.  No orders of the defined types were placed in this encounter.     Follow-Up Instructions: Return in about 1 year (around 08/06/2020) for Raynaud's syndrome, Osteoarthritis.   Ofilia Neas, PA-C   I examined and evaluated the patient with Morgan Sams PA.  I detailed discussion with patient regarding her labs.   Her repeat c-ANCA was negative.  Her SSB antibody continues to be positive.  She has mild sicca symptoms.  We discussed the option of pilocarpine but she declined.  Over-the-counter products were discussed at length.  She continues to have some joint stiffness and gives history of intermittent joint swelling.  I did not see any synovitis on the examination.  I offered ultrasound of her bilateral hands but she declined.  I have advised her to contact us in case she develops any increased swelling.  She was in agreement.  The plan of care was discussed as noted above.  Bo Merino, MD  Note - This record has been created using Editor, commissioning.  Chart creation errors have been sought, but may not always  have been located. Such creation errors do not reflect on  the standard of medical care.

## 2019-08-07 ENCOUNTER — Encounter: Payer: Self-pay | Admitting: Rheumatology

## 2019-08-07 ENCOUNTER — Other Ambulatory Visit: Payer: Self-pay

## 2019-08-07 ENCOUNTER — Ambulatory Visit: Payer: BC Managed Care – PPO | Admitting: Rheumatology

## 2019-08-07 VITALS — BP 117/81 | HR 60 | Resp 11 | Ht 65.0 in | Wt 137.0 lb

## 2019-08-07 DIAGNOSIS — M3500 Sicca syndrome, unspecified: Secondary | ICD-10-CM

## 2019-08-07 DIAGNOSIS — I890 Lymphedema, not elsewhere classified: Secondary | ICD-10-CM

## 2019-08-07 DIAGNOSIS — Z8679 Personal history of other diseases of the circulatory system: Secondary | ICD-10-CM

## 2019-08-07 DIAGNOSIS — I7300 Raynaud's syndrome without gangrene: Secondary | ICD-10-CM

## 2019-08-07 DIAGNOSIS — M2560 Stiffness of unspecified joint, not elsewhere classified: Secondary | ICD-10-CM | POA: Diagnosis not present

## 2019-08-07 DIAGNOSIS — M170 Bilateral primary osteoarthritis of knee: Secondary | ICD-10-CM | POA: Diagnosis not present

## 2019-09-04 ENCOUNTER — Other Ambulatory Visit: Payer: Self-pay

## 2019-09-04 ENCOUNTER — Ambulatory Visit: Payer: BC Managed Care – PPO | Admitting: Vascular Surgery

## 2019-09-04 ENCOUNTER — Encounter: Payer: Self-pay | Admitting: Vascular Surgery

## 2019-09-04 VITALS — BP 120/82 | HR 68 | Temp 98.8°F | Resp 14 | Ht 65.0 in | Wt 136.0 lb

## 2019-09-04 DIAGNOSIS — R220 Localized swelling, mass and lump, head: Secondary | ICD-10-CM | POA: Diagnosis not present

## 2019-09-04 DIAGNOSIS — M7989 Other specified soft tissue disorders: Secondary | ICD-10-CM | POA: Diagnosis not present

## 2019-09-04 NOTE — Progress Notes (Signed)
REASON FOR CONSULT:    Lymphedema of the right lower extremity.  The consult is requested by Dr. Deveshwar.  ASSESSMENT & PLAN:   HISTORY OF RIGHT LCorliss SkainsWER EXTREMITY SWELLING: This patient has a long history of right lower extremity swelling but actually has no swelling in the right leg at this time.  She has no previous history of DVT.  We have discussed the importance of intermittent leg elevation and the proper positioning for this.  I would not recommend a more aggressive approach to this unless her swelling worsens or became symptomatic.  MILD FACIAL SWELLING: She has no history to suggest a reason for her to have SVC syndrome.  She has had no previous catheters, pacemakers, surgery, or radiation therapy.  I discussed getting a CT of the chest but I think at this point that would be overkill.  If she does develop new symptoms that I think this would be a consideration.  We discussed potentially putting the back of her bed up on a block.  I will see her back as needed.   Morgan Ferrarihristopher Delanna Blacketer, MD Office: [phone]   HPI:   Morgan Flynn is a pleasant 53 y.o. female, who was referred with lymphedema of the right lower extremity.  I reviewed the records from the referring office.  The patient was seen on 08/07/2019 with pain in both knee joints.  The patient has a history of Raynaud's and osteoarthritis.  She was complaining of intermittent pain and swelling in both knee joints and her wrist joints.  The swelling is worse in the morning.  She also has some chronic neck pain and stiffness.  On my history the patient notes some facial swelling when she wakes up in the morning and also swelling in her hands.  She denies swelling in her arms.  She has no previous history of central venous catheters or dialysis catheters.  She does not have a pacemaker.  She denies any previous radiation therapy or surgery to her neck or chest.  She denies fever, night sweats, or significant weight loss.  She  does note that the swelling in her face began approximately 3 years ago after a long trip where she was in [place] in [place].  However she denies any illness during that trip.  She also needs chronic swelling in her right leg which she has had for 25 years.  Currently she states that there is no significant swelling in the right leg and this usually only occurs in the summer.  She denies any previous history of DVT.  History reviewed. No pertinent past medical history.  Family History  Problem Relation Age of Onset  . Breast cancer Sister   . Ovarian cancer Sister   . Heart Problems Mother   . Healthy Son     SOCIAL HISTORY: Social History   Socioeconomic History  . Marital status: Married    Spouse name: Not on file  . Number of children: Not on file  . Years of education: Not on file  . Highest education level: Not on file  Occupational History  . Not on file  Social Needs  . Financial resource strain: Not on file  . Food insecurity    Worry: Not on file    Inability: Not on file  . Transportation needs    Medical: Not on file    Non-medical: Not on file  Tobacco Use  . Smoking status: Never Smoker  . Smokeless tobacco: Never Used  Substance and Sexual  Activity  . Alcohol use: Yes    Comment: rarely  . Drug use: Never  . Sexual activity: Not on file  Lifestyle  . Physical activity    Days per week: Not on file    Minutes per session: Not on file  . Stress: Not on file  Relationships  . Social Herbalist on phone: Not on file    Gets together: Not on file    Attends religious service: Not on file    Active member of club or organization: Not on file    Attends meetings of clubs or organizations: Not on file    Relationship status: Not on file  . Intimate partner violence    Fear of current or ex partner: Not on file    Emotionally abused: Not on file    Physically abused: Not on file    Forced sexual activity: Not on file  Other Topics Concern  .  Not on file  Social History Narrative  . Not on file    No Known Allergies  No current outpatient medications on file.   No current facility-administered medications for this visit.     REVIEW OF SYSTEMS:  [X]  denotes positive finding, [ ]  denotes negative finding Cardiac  Comments:  Chest pain or chest pressure:    Shortness of breath upon exertion:    Short of breath when lying flat:    Irregular heart rhythm:        Vascular    Pain in calf, thigh, or hip brought on by ambulation:    Pain in feet at night that wakes you up from your sleep:     Blood clot in your veins:    Leg swelling:  x       Pulmonary    Oxygen at home:    Productive cough:     Wheezing:         Neurologic    Sudden weakness in arms or legs:     Sudden numbness in arms or legs:     Sudden onset of difficulty speaking or slurred speech:    Temporary loss of vision in one eye:     Problems with dizziness:         Gastrointestinal    Blood in stool:     Vomited blood:         Genitourinary    Burning when urinating:     Blood in urine:        Psychiatric    Major depression:         Hematologic    Bleeding problems:    Problems with blood clotting too easily:        Skin    Rashes or ulcers:        Constitutional    Fever or chills:     PHYSICAL EXAM:   Vitals:   09/04/19 1527  BP: 120/82  Pulse: 68  Resp: 14  Temp: 98.8 F (37.1 C)  TempSrc: Temporal  SpO2: 100%  Weight: 136 lb (61.7 kg)  Height: 5\' 5"  (1.651 m)    GENERAL: The patient is a well-nourished female, in no acute distress. The vital signs are documented above. CARDIAC: There is a regular rate and rhythm.  VASCULAR: I do not detect carotid bruits. I did look at her jugular veins myself with the SonoSite and there is no evidence of DVT. She has palpable radial and pedal pulses. Currently she has no significant  lower extremity swelling. PULMONARY: There is good air exchange bilaterally without wheezing or  rales. ABDOMEN: Soft and non-tender with normal pitched bowel sounds.  MUSCULOSKELETAL: There are no major deformities or cyanosis. NEUROLOGIC: No focal weakness or paresthesias are detected. SKIN: There are no ulcers or rashes noted. PSYCHIATRIC: The patient has a normal affect.  DATA:    I have reviewed the records from the referring office and the labs that were obtained there. Her SSB was elevated at 2.2.

## 2019-09-30 ENCOUNTER — Emergency Department (HOSPITAL_COMMUNITY): Admission: EM | Admit: 2019-09-30 | Payer: BC Managed Care – PPO | Source: Home / Self Care

## 2019-11-05 DIAGNOSIS — J0141 Acute recurrent pansinusitis: Secondary | ICD-10-CM | POA: Diagnosis not present

## 2019-11-05 DIAGNOSIS — J310 Chronic rhinitis: Secondary | ICD-10-CM | POA: Diagnosis not present

## 2019-11-05 DIAGNOSIS — R220 Localized swelling, mass and lump, head: Secondary | ICD-10-CM | POA: Diagnosis not present

## 2019-12-10 ENCOUNTER — Ambulatory Visit: Payer: BC Managed Care – PPO | Admitting: Allergy

## 2019-12-12 ENCOUNTER — Ambulatory Visit: Payer: BC Managed Care – PPO | Admitting: Allergy

## 2019-12-12 ENCOUNTER — Encounter: Payer: Self-pay | Admitting: Allergy

## 2019-12-12 ENCOUNTER — Other Ambulatory Visit: Payer: Self-pay

## 2019-12-12 VITALS — BP 124/82 | HR 74 | Temp 97.7°F | Resp 16 | Ht 64.5 in | Wt 138.2 lb

## 2019-12-12 DIAGNOSIS — T783XXD Angioneurotic edema, subsequent encounter: Secondary | ICD-10-CM

## 2019-12-12 NOTE — Progress Notes (Signed)
New Patient Note  RE: Morgan Flynn MRN: [id number] DOB: May 23, 1966 Date of Office Visit: 12/12/2019  Referring provider: Catha Gosselin, MD Primary care provider: Catha Gosselin, MD  Chief Complaint: Facial swelling  History of present illness: Morgan Flynn is a 54 y.o. female presenting today for consultation for facial swelling.  She states for the past 4 years or so she has been waking up with facial swelling mostly puffiness around her eyes in the morning.  She states she gets sinus infections about 1-2 times a year that she treated with an antibiotic course with improvement.  She states she used to travel a lot overseas and she was always having issues with her sinuses and getting infections.  She has not been traveling over the past year and she still struggling with her sinuses.  She reports postnasal drainage.  She did see ENT Dr. Annalee Genta who did not find a reason for her edema and is recommended she have a sinus CT scan performed which is scheduled for 12/22/2019.  He also recommended that she use Flonase which she states has been helping her nasal symptoms.  She states she used to take Allegra as needed as well as Mucinex which were both helpful she feels at times.  She states 4 years ago she was on a trip mostly in [place] and when she returned [place] is when the swelling started and she did have lip swelling initially.  However she feels that the swelling episodes have gotten better as she has not had the lip swelling again.  She denies having any episodes of hives with the swelling.  She does report that she wakes up with stiffness in her fingers in the morning.  She has seen a rheumatologist for this and does have a diagnosis of Raynaud's. She denies any family history of any swelling episodes that she is aware of.  She has never had any environmental allergy testing at this time.  She has had quite the rheumatologic work-up was found to be SSB positive.  She  does report having eczema as occasional 54 years old but this resolved as she aged.   Review of systems: Review of Systems  Constitutional: Negative.   HENT:       See HPI  Eyes: Negative.   Respiratory: Negative.   Cardiovascular: Negative.   Gastrointestinal: Negative.   Musculoskeletal: Negative.   Skin: Negative.   Neurological: Negative.     All other systems negative unless noted above in HPI  Past medical history: Past Medical History:  Diagnosis Date  . Raynaud's disease with gangrene [facility])     Past surgical history: Past Surgical History:  Procedure Laterality Date  . APPENDECTOMY     age 55  . CERVICAL BIOPSY  W/ LOOP ELECTRODE EXCISION  2000  . CESAREAN SECTION      Family history:  Family History  Problem Relation Age of Onset  . Breast cancer Sister   . Ovarian cancer Sister   . Heart Problems Mother   . Healthy Son     Social history: She lives in a townhome with carpeting in the bedroom with electric heating.  No pets in the home.  There is no concern for water damage, mildew or roaches in the home.  She is a Editor, commissioning.  Denies a smoking history.  Medication List: Current Outpatient Medications  Medication Sig Dispense Refill  . Cyanocobalamin (VITAMIN B-12 PO) Take by mouth.    . fluticasone (FLONASE) 50 MCG/ACT  nasal spray Place 2 sprays into both nostrils daily.    . Omega-3 Fatty Acids (FISH OIL PO) Take by mouth.    . pseudoephedrine-guaifenesin (MUCINEX D) 60-600 MG 12 hr tablet Take by mouth.    Marland Kitchen VITAMIN D PO Take by mouth.     No current facility-administered medications for this visit.    Known medication allergies: No Known Allergies   Physical examination: Blood pressure 124/82, pulse 74, temperature 97.7 F (36.5 C), temperature source Temporal, resp. rate 16, height 5' 4.5" (1.638 m), weight 138 lb 3.2 oz (62.7 kg), SpO2 97 %.  General: Alert, interactive, in no acute distress. HEENT: PERRLA, TMs pearly gray, turbinates  non-edematous without discharge, post-pharynx non erythematous. Neck: Supple without lymphadenopathy. Lungs: Clear to auscultation without wheezing, rhonchi or rales. {no increased work of breathing. CV: Normal newborn apartment but unable, S2 without murmurs. Abdomen: Nondistended, nontender. Skin: Warm and dry, without lesions or rashes. Extremities:  No clubbing, cyanosis or edema. Neuro:   Grossly intact.  Diagnositics/Labs:  Allergy testing: Environmental allergy skin testing is positive to hickory, oak, pecan, walnut, Fusarium and both dust mites.  Common 10 food allergy skin prick testing is negative. Allergy testing results were read and interpreted by provider, documented by clinical staff.   Assessment and plan:   Facial Swelling   - environmental allergy skin testing is positive to tree pollen, mold and dust mites.  - allergen avoidance measures discussed/handouts provided  - try use of long-acting antihistamine Xyzal 5mg  daily - this should help with allergy based symptoms including facial swelling if driven by your allergens  - continue Flonase as directed by ENT  - agree with having CT sinus scan performed  - will also obtain Hereditary angioedema (HAE) panel.  HAE if positive can lead to recurrent swelling episodes of any body area that is non-histamine driven.  If this panel is positive will discuss HAE in further detail and treatment options.    Follow-up 4 months or sooner if needed  I appreciate the opportunity to take part in Shericka's care. Please do not hesitate to contact me with questions.  Sincerely,   Prudy Feeler, MD Allergy/Immunology [facility] [place] [facility]

## 2019-12-12 NOTE — Patient Instructions (Addendum)
Facial Swelling   - environmental allergy skin testing is positive to tree pollen, mold and dust mites.  - allergen avoidance measures discussed/handouts provided  - try use of long-acting antihistamine Xyzal 5mg  daily - this should help with allergy based symptoms including facial swelling if driven by your allergens  - continue Flonase as directed by ENT  - agree with having CT sinus scan performed  - will also obtain Hereditary angioedema (HAE) panel.  HAE if positive can lead to recurrent swelling episodes of any body area that is non-histamine driven.  If this panel is positive will discuss HAE in further detail and treatment options.    Follow-up 4 months or sooner if needed

## 2019-12-20 LAB — C1 ESTERASE INHIBITOR, FUNCTIONAL: C1INH Functional/C1INH Total MFr SerPl: >100 %mean normal

## 2019-12-20 LAB — C1 ESTERASE INHIBITOR: C1INH SerPl-mCnc: 29 mg/dL (ref 21–39)

## 2019-12-20 LAB — TRYPTASE: Tryptase: 5.3 ug/L (ref 2.2–13.2)

## 2019-12-20 LAB — C4 COMPLEMENT: Complement C4, Serum: 23 mg/dL (ref 12–38)

## 2019-12-20 LAB — COMPLEMENT COMPONENT C1Q: Complement C1Q: 12.3 mg/dL (ref 10.3–20.5)

## 2019-12-23 DIAGNOSIS — J0141 Acute recurrent pansinusitis: Secondary | ICD-10-CM | POA: Diagnosis not present

## 2019-12-23 DIAGNOSIS — J019 Acute sinusitis, unspecified: Secondary | ICD-10-CM | POA: Diagnosis not present

## 2019-12-23 DIAGNOSIS — R220 Localized swelling, mass and lump, head: Secondary | ICD-10-CM | POA: Diagnosis not present

## 2019-12-31 DIAGNOSIS — Z0000 Encounter for general adult medical examination without abnormal findings: Secondary | ICD-10-CM | POA: Diagnosis not present

## 2019-12-31 DIAGNOSIS — Z23 Encounter for immunization: Secondary | ICD-10-CM | POA: Diagnosis not present

## 2019-12-31 DIAGNOSIS — R8290 Unspecified abnormal findings in urine: Secondary | ICD-10-CM | POA: Diagnosis not present

## 2020-03-16 DIAGNOSIS — Z23 Encounter for immunization: Secondary | ICD-10-CM | POA: Diagnosis not present

## 2020-06-25 ENCOUNTER — Other Ambulatory Visit: Payer: Self-pay | Admitting: Obstetrics and Gynecology

## 2020-06-25 DIAGNOSIS — Z0000 Encounter for general adult medical examination without abnormal findings: Secondary | ICD-10-CM

## 2020-07-08 ENCOUNTER — Other Ambulatory Visit: Payer: Self-pay

## 2020-07-08 ENCOUNTER — Ambulatory Visit
Admission: RE | Admit: 2020-07-08 | Discharge: 2020-07-08 | Disposition: A | Discharge status: Home / Self Care | Payer: BC Managed Care – PPO | Source: Ambulatory Visit | Attending: Obstetrics and Gynecology | Admitting: Obstetrics and Gynecology

## 2020-07-08 DIAGNOSIS — Z1231 Encounter for screening mammogram for malignant neoplasm of breast: Secondary | ICD-10-CM | POA: Diagnosis not present

## 2020-07-08 DIAGNOSIS — Z0000 Encounter for general adult medical examination without abnormal findings: Secondary | ICD-10-CM

## 2020-07-20 NOTE — Progress Notes (Deleted)
Office Visit Note  Patient: Morgan Flynn             Date of Birth: 08/21/1966           MRN: [id number]             PCP: Catha Gosselin, MD Referring: Catha Gosselin, MD Visit Date: 08/03/2020 Occupation: @GUAROCC @  Subjective:  No chief complaint on file.   History of Present Illness: Morgan Flynn is a 54 y.o. female ***   Activities of Daily Living:  Patient reports morning stiffness for *** {minute/hour:19697}.   Patient {ACTIONS;DENIES/REPORTS:21021675::"Denies"} nocturnal pain.  Difficulty dressing/grooming: {ACTIONS;DENIES/REPORTS:21021675::"Denies"} Difficulty climbing stairs: {ACTIONS;DENIES/REPORTS:21021675::"Denies"} Difficulty getting out of chair: {ACTIONS;DENIES/REPORTS:21021675::"Denies"} Difficulty using hands for taps, buttons, cutlery, and/or writing: {ACTIONS;DENIES/REPORTS:21021675::"Denies"}  No Rheumatology ROS completed.   PMFS History:  Patient Active Problem List   Diagnosis Date Noted  . Raynaud's syndrome without gangrene 07/09/2019  . Primary osteoarthritis of both knees 07/09/2019    Past Medical History:  Diagnosis Date  . Raynaud's disease with gangrene (HCC)     Family History  Problem Relation Age of Onset  . Breast cancer Sister   . Ovarian cancer Sister   . Heart Problems Mother   . Healthy Son    Past Surgical History:  Procedure Laterality Date  . APPENDECTOMY     age 60  . CERVICAL BIOPSY  W/ LOOP ELECTRODE EXCISION  2000  . CESAREAN SECTION     Social History   Social History Narrative  . Not on file    There is no immunization history on file for this patient.   Objective: Vital Signs: There were no vitals taken for this visit.   Physical Exam   Musculoskeletal Exam: ***  CDAI Exam: CDAI Score: -- Patient Global: --; Provider Global: -- Swollen: --; Tender: -- Joint Exam 08/03/2020   No joint exam has been documented for this visit   There is currently no information documented on the  homunculus. Go to the Rheumatology activity and complete the homunculus joint exam.  Investigation: No additional findings.  Imaging: MM 3D SCREEN BREAST BILATERAL  Result Date: 07/09/2020 CLINICAL DATA:  Screening. EXAM: DIGITAL SCREENING BILATERAL MAMMOGRAM WITH TOMO AND CAD COMPARISON:  Previous exam(s). ACR Breast Density Category c: The breast tissue is heterogeneously dense, which may obscure small masses. FINDINGS: There are no findings suspicious for malignancy. Images were processed with CAD. IMPRESSION: No mammographic evidence of malignancy. A result letter of this screening mammogram will be mailed directly to the patient. RECOMMENDATION: Screening mammogram in one year. (Code:SM-B-01Y) BI-RADS CATEGORY  1: Negative. Electronically Signed   By: 09/08/2020 M.D.   On: 07/09/2020 10:28    Recent Labs: Lab Results  Component Value Date   WBC 4.2 07/09/2019   HGB 13.2 07/09/2019   PLT 194 07/09/2019   NA 141 07/09/2019   K 4.1 07/09/2019   CL 104 07/09/2019   CO2 32 07/09/2019   GLUCOSE 96 07/09/2019   BUN 13 07/09/2019   CREATININE 0.64 07/09/2019   BILITOT 0.5 07/09/2019   AST 18 07/09/2019   ALT 13 07/09/2019   PROT 7.1 07/09/2019   CALCIUM 9.6 07/09/2019   GFRAA 117 07/09/2019    Speciality Comments: No specialty comments available.  Procedures:  No procedures performed Allergies: Patient has no known allergies.   Assessment / Plan:     Visit Diagnoses: No diagnosis found.  Orders: No orders of the defined types were placed in this encounter.  No orders of  the defined types were placed in this encounter.   Face-to-face time spent with patient was *** minutes. Greater than 50% of time was spent in counseling and coordination of care.  Follow-Up Instructions: No follow-ups on file.   Gearldine Bienenstock, PA-C  Note - This record has been created using Dragon software.  Chart creation errors have been sought, but may not always  have been located. Such  creation errors do not reflect on  the standard of medical care.

## 2020-08-03 ENCOUNTER — Ambulatory Visit: Payer: BC Managed Care – PPO | Admitting: Rheumatology

## 2021-05-09 ENCOUNTER — Ambulatory Visit (HOSPITAL_BASED_OUTPATIENT_CLINIC_OR_DEPARTMENT_OTHER): Payer: BC Managed Care – PPO | Admitting: Family Medicine

## 2021-05-31 ENCOUNTER — Other Ambulatory Visit: Payer: Self-pay

## 2021-05-31 ENCOUNTER — Ambulatory Visit (HOSPITAL_BASED_OUTPATIENT_CLINIC_OR_DEPARTMENT_OTHER): Payer: 59 | Admitting: Family Medicine

## 2021-05-31 ENCOUNTER — Encounter (HOSPITAL_BASED_OUTPATIENT_CLINIC_OR_DEPARTMENT_OTHER): Payer: Self-pay | Admitting: Family Medicine

## 2021-05-31 DIAGNOSIS — J3089 Other allergic rhinitis: Secondary | ICD-10-CM | POA: Diagnosis not present

## 2021-05-31 NOTE — Patient Instructions (Signed)
  Medication Instructions:  Your physician recommends that you continue on your current medications as directed. Please refer to the Current Medication list given to you today. --If you need a refill on any your medications before your next appointment, please call your pharmacy first. If no refills are authorized on file call the office.--  Follow-Up: Your next appointment:   Your physician recommends that you schedule a follow-up appointment in: 6 MONTHS with Dr. de Cuba  Thanks for letting us be apart of your health journey!!  Primary Care and Sports Medicine   Dr. Raymond de Cuba   We encourage you to activate your patient portal called "MyChart".  Sign up information is provided on this After Visit Summary.  MyChart is used to connect with patients for Virtual Visits (Telemedicine).  Patients are able to view lab/test results, encounter notes, upcoming appointments, etc.  Non-urgent messages can be sent to your provider as well. To learn more about what you can do with MyChart, please visit --  [email or web address].    

## 2021-05-31 NOTE — Progress Notes (Signed)
New Patient Office Visit  Subjective:  Patient ID: Morgan Flynn, female    DOB: 07/27/1966  Age: 55 y.o. MRN: [id number]  CC:  Chief Complaint  Patient presents with   Establish Care    Previous PCP Dr. Catha Gosselin Deboraha Sprang at [facility] Covid     Patient was diagnosed with Covid in May. She states since she has been experiencing pain and swelling in the top of her head, prolonged sinus issues, and lower BP at times. She states he BP can be in the 80's/60's. She was seen by her previous PCP and prescribed antibiotics for the sinus/nasal congestion but states she got very little relief    HPI [name] is a 55 year old female presenting to establish in clinic.  She has current concerns today related to ongoing sinus issues, postnasal drip.  Past medical history significant for Raynaud's, chronic sinus issues.  Sinus drainage: Reports that in May she was diagnosed with coronavirus infection.  Since that time she has been noticing issues with sinus drainage, postnasal drip, pain over sinuses at times.  She does have a history of sinus issues and was seen by ENT last year as well as allergist.  At that time CT scan of the sinuses was completed which was largely unremarkable.  She was also placed on treatment regimen by allergist.  She reports that she has seen ENT more recently, but has not had follow-up with allergist since last year.  Also reports that she had a painful area on her scalp about 3 weeks after coronavirus infection this reportedly was about an inch and a half wide extended from around her neck up through her scalp on the left side.  Symptoms from this have improved although she still has some mild discomfort in the area.  Primary symptoms included pain and irritation.  Denies ever noticing a rash in the scalp.  Past Medical History:  Diagnosis Date   Raynaud's disease with gangrene [facility])     Past Surgical History:  Procedure Laterality Date    APPENDECTOMY     age 56   CERVICAL BIOPSY  W/ LOOP ELECTRODE EXCISION  2000   CESAREAN SECTION      Family History  Problem Relation Age of Onset   Heart Problems Mother    Other Mother        Died from Covid complications   Other Father 18       Died from Covid Complication   Breast cancer Sister    Ovarian cancer Sister    Healthy Son     Social History   Socioeconomic History   Marital status: Divorced    Spouse name: Not on file   Number of children: Not on file   Years of education: Not on file   Highest education level: Not on file  Occupational History   Not on file  Tobacco Use   Smoking status: Never    Passive exposure: Never   Smokeless tobacco: Never  Vaping Use   Vaping Use: Never used  Substance and Sexual Activity   Alcohol use: Yes    Alcohol/week: 1.0 standard drink    Types: 1 Glasses of wine per week    Comment: rarely   Drug use: Never   Sexual activity: Yes    Birth control/protection: None  Other Topics Concern   Not on file  Social History Narrative   Not on file   Social Determinants of Corporate investment banker  Strain: Not on file  Food Insecurity: Not on file  Transportation Needs: Not on file  Physical Activity: Not on file  Stress: Not on file  Social Connections: Not on file  Intimate Partner Violence: Not on file    Objective:   Today's Vitals: BP 114/72   Pulse 63   Ht 5\' 5"  (1.651 m)   Wt 135 lb (61.2 kg)   SpO2 97%   BMI 22.47 kg/m   Physical Exam  55 year old female in no acute distress Cardiovascular exam with regular rate and rhythm, no murmurs appreciated Lungs clear to auscultation bilaterally Inspection of scalp does not reveal any current rash, swelling, inflammation  Assessment & Plan:   Problem List Items Addressed This Visit       Other   Environmental and seasonal allergies    Patient with current acute exacerbation of symptoms with known chronic sinus issues Has had follow-up with ENT  recently, has not had recent follow-up with allergist Recommend following up with allergist to determine if further treatment or evaluation is required to help in controlling symptoms Continue with regular use of medications as prescribed previously by allergist        Outpatient Encounter Medications as of 05/31/2021  Medication Sig   [DISCONTINUED] Cyanocobalamin (VITAMIN B-12 PO) Take by mouth.   [DISCONTINUED] fluticasone (FLONASE) 50 MCG/ACT nasal spray Place 2 sprays into both nostrils daily.   [DISCONTINUED] Omega-3 Fatty Acids (FISH OIL PO) Take by mouth.   [DISCONTINUED] pseudoephedrine-guaifenesin (MUCINEX D) 60-600 MG 12 hr tablet Take by mouth.   [DISCONTINUED] VITAMIN D PO Take by mouth.   No facility-administered encounter medications on file as of 05/31/2021.   Reviewed labs brought in by patient which were completed with prior PCP as well as current OB/GYN specialist.  No notable abnormalities observed on knees, slight elevation in total cholesterol and LDL.  Hemoglobin A1c at 5.6%.  Follow-up: Return in about 6 months (around 12/01/2021).   Brodin Gelpi J De 01/29/2022, MD

## 2021-05-31 NOTE — Assessment & Plan Note (Signed)
Patient with current acute exacerbation of symptoms with known chronic sinus issues Has had follow-up with ENT recently, has not had recent follow-up with allergist Recommend following up with allergist to determine if further treatment or evaluation is required to help in controlling symptoms Continue with regular use of medications as prescribed previously by allergist

## 2022-01-17 ENCOUNTER — Other Ambulatory Visit: Payer: Self-pay | Admitting: Obstetrics and Gynecology

## 2022-01-17 DIAGNOSIS — Z1231 Encounter for screening mammogram for malignant neoplasm of breast: Secondary | ICD-10-CM

## 2022-02-06 ENCOUNTER — Ambulatory Visit
Admission: RE | Admit: 2022-02-06 | Discharge: 2022-02-06 | Disposition: A | Discharge status: Home / Self Care | Payer: 59 | Source: Ambulatory Visit | Attending: Obstetrics and Gynecology | Admitting: Obstetrics and Gynecology

## 2022-02-06 DIAGNOSIS — Z1231 Encounter for screening mammogram for malignant neoplasm of breast: Secondary | ICD-10-CM

## 2022-02-20 ENCOUNTER — Encounter (HOSPITAL_BASED_OUTPATIENT_CLINIC_OR_DEPARTMENT_OTHER): Payer: 59 | Admitting: Family Medicine

## 2022-02-23 ENCOUNTER — Encounter (HOSPITAL_BASED_OUTPATIENT_CLINIC_OR_DEPARTMENT_OTHER): Payer: Self-pay | Admitting: Family Medicine

## 2022-02-23 ENCOUNTER — Ambulatory Visit (INDEPENDENT_AMBULATORY_CARE_PROVIDER_SITE_OTHER): Payer: 59 | Admitting: Family Medicine

## 2022-02-23 DIAGNOSIS — Z0000 Encounter for general adult medical examination without abnormal findings: Secondary | ICD-10-CM

## 2022-02-23 DIAGNOSIS — M25511 Pain in right shoulder: Secondary | ICD-10-CM

## 2022-02-23 DIAGNOSIS — Z1329 Encounter for screening for other suspected endocrine disorder: Secondary | ICD-10-CM | POA: Insufficient documentation

## 2022-02-23 DIAGNOSIS — R5383 Other fatigue: Secondary | ICD-10-CM

## 2022-02-23 NOTE — Assessment & Plan Note (Addendum)
Routine HCM labs ordered. HCM reviewed/discussed. Anticipatory guidance regarding healthy weight, lifestyle and choices given. ?Recommend healthy diet.  Recommend approximately 150 minutes/week of moderate intensity exercise ?Recommend regular dental and vision exams ?Always use seatbelt/lap and shoulder restraints ?Recommend using smoke alarms and checking batteries at least twice a year ?Recommend using sunscreen when outside ?With prior PCP through [facility], patient had completed colonoscopy with recommendation for this to be repeated 5 years from now, she has also completed shingles vaccine series ?Patient requesting additional labs today including vitamin D and vitamin B12, she is aware that these may not be fully covered by insurance ?

## 2022-02-23 NOTE — Progress Notes (Signed)
?Subjective:   ? ?CC: Annual Physical Exam ? ?HPI:  ?Morgan Flynn is a 56 y.o. presenting for annual physical ? ?I reviewed the past medical history, family history, social history, surgical history, and allergies today and no changes were needed.  Please see the problem list section below in epic for further details. ? ?Past Medical History: ?Past Medical History:  ?Diagnosis Date  ? Raynaud's disease with gangrene (HCC)   ? ?Past Surgical History: ?Past Surgical History:  ?Procedure Laterality Date  ? APPENDECTOMY    ? age 19  ? CERVICAL BIOPSY  W/ LOOP ELECTRODE EXCISION  2000  ? CESAREAN SECTION    ? ?Social History: ?Social History  ? ?Socioeconomic History  ? Marital status: Divorced  ?  Spouse name: Not on file  ? Number of children: Not on file  ? Years of education: Not on file  ? Highest education level: Not on file  ?Occupational History  ? Not on file  ?Tobacco Use  ? Smoking status: Never  ?  Passive exposure: Never  ? Smokeless tobacco: Never  ?Vaping Use  ? Vaping Use: Never used  ?Substance and Sexual Activity  ? Alcohol use: Yes  ?  Alcohol/week: 1.0 standard drink  ?  Types: 1 Glasses of wine per week  ?  Comment: rarely  ? Drug use: Never  ? Sexual activity: Yes  ?  Birth control/protection: None  ?Other Topics Concern  ? Not on file  ?Social History Narrative  ? Not on file  ? ?Social Determinants of Health  ? ?Financial Resource Strain: Not on file  ?Food Insecurity: Not on file  ?Transportation Needs: Not on file  ?Physical Activity: Not on file  ?Stress: Not on file  ?Social Connections: Not on file  ? ?Family History: ?Family History  ?Problem Relation Age of Onset  ? Heart Problems Mother   ? Other Mother   ?     Died from Covid complications  ? Other Father 65  ?     Died from Covid Complication  ? Breast cancer Sister   ? Ovarian cancer Sister   ? Healthy Son   ? ?Allergies: ?Allergies  ?Allergen Reactions  ? Black Walnut Pollen Cough  ? Molds & Smuts   ? Dust Mite Extract Rash   ? ?Medications: See med rec. ? ?Review of Systems: No headache, visual changes, nausea, vomiting, diarrhea, constipation, dizziness, abdominal pain, skin rash, fevers, chills, night sweats, swollen lymph nodes, weight loss, chest pain, body aches, joint swelling, muscle aches, shortness of breath, mood changes, visual or auditory hallucinations. ? ?Objective:   ? ?BP 124/88   Pulse 64   Temp 97.8 ?F (36.6 ?C) (Oral)   Ht 5\' 5"  (1.651 m)   Wt 141 lb 12.8 oz (64.3 kg)   SpO2 100%   BMI 23.60 kg/m?  ? ?General: Well Developed, well nourished, and in no acute distress.  ?Neuro: Alert and oriented x3, extra-ocular muscles intact, sensation grossly intact. Cranial nerves II through XII are intact, motor, sensory, and coordinative functions are all intact. ?HEENT: Normocephalic, atraumatic, pupils equal round reactive to light, neck supple, no masses, no lymphadenopathy, thyroid nonpalpable. Oropharynx, nasopharynx, external ear canals are unremarkable. ?Skin: Warm and dry, no rashes noted.  ?Cardiac: Regular rate and rhythm, no murmurs rubs or gallops.  ?Respiratory: Clear to auscultation bilaterally. Not using accessory muscles, speaking in full sentences.  ?Abdominal: Soft, nontender, nondistended, positive bowel sounds, no masses, no organomegaly.  ?Musculoskeletal: Shoulder, elbow, wrist,  hip, knee, ankle stable, and with full range of motion. ? ?Impression and Recommendations:   ? ?Wellness examination ?Routine HCM labs ordered. HCM reviewed/discussed. Anticipatory guidance regarding healthy weight, lifestyle and choices given. ?Recommend healthy diet.  Recommend approximately 150 minutes/week of moderate intensity exercise ?Recommend regular dental and vision exams ?Always use seatbelt/lap and shoulder restraints ?Recommend using smoke alarms and checking batteries at least twice a year ?Recommend using sunscreen when outside ?With prior PCP through [facility], patient had completed colonoscopy with recommendation  for this to be repeated 5 years from now, she has also completed shingles vaccine series ?Patient requesting additional labs today including vitamin D and vitamin B12, she is aware that these may not be fully covered by insurance ? ?Right shoulder pain ?Reports having some recent shoulder pain, mostly over lateral aspect of shoulder, aggravated with range of motion, lifting certain things ?Mild pain with Juanetta Gosling, Neer's.  Normal strength ?Discussed that she could continue with conservative measures, home exercises, can consider referral to physical therapy ?If she would like to proceed with physical therapy referral, she will let us know and we can place referral to PT downstairs ? ?Plan for follow-up in 1 year or sooner as needed ? ? ?___________________________________________ ?Alter Moss de [place], MD, ABFM, CAQSM ?Primary Care and Sports Medicine ?[facility] [place] ?

## 2022-02-23 NOTE — Assessment & Plan Note (Signed)
Reports having some recent shoulder pain, mostly over lateral aspect of shoulder, aggravated with range of motion, lifting certain things ?Mild pain with Luan Pulling, Neer's.  Normal strength ?Discussed that she could continue with conservative measures, home exercises, can consider referral to physical therapy ?If she would like to proceed with physical therapy referral, she will let us know and we can place referral to PT downstairs ?

## 2022-02-28 ENCOUNTER — Ambulatory Visit (HOSPITAL_BASED_OUTPATIENT_CLINIC_OR_DEPARTMENT_OTHER): Payer: 59

## 2022-02-28 ENCOUNTER — Other Ambulatory Visit (HOSPITAL_BASED_OUTPATIENT_CLINIC_OR_DEPARTMENT_OTHER): Payer: Self-pay | Admitting: Family Medicine

## 2022-03-01 LAB — COMPREHENSIVE METABOLIC PANEL
ALT: 12 IU/L (ref 0–32)
AST: 22 IU/L (ref 0–40)
Albumin/Globulin Ratio: 2 (ref 1.2–2.2)
Albumin: 4.8 g/dL (ref 3.8–4.9)
Alkaline Phosphatase: 71 IU/L (ref 44–121)
BUN/Creatinine Ratio: 14 (ref 9–23)
BUN: 10 mg/dL (ref 6–24)
Bilirubin Total: 0.5 mg/dL (ref 0.0–1.2)
CO2: 24 mmol/L (ref 20–29)
Calcium: 9.6 mg/dL (ref 8.7–10.2)
Chloride: 104 mmol/L (ref 96–106)
Creatinine, Ser: 0.73 mg/dL (ref 0.57–1.00)
Globulin, Total: 2.4 g/dL (ref 1.5–4.5)
Glucose: 112 mg/dL — ABNORMAL HIGH (ref 70–99)
Potassium: 4.8 mmol/L (ref 3.5–5.2)
Sodium: 146 mmol/L — ABNORMAL HIGH (ref 134–144)
Total Protein: 7.2 g/dL (ref 6.0–8.5)
eGFR: 96 mL/min/{1.73_m2} (ref >59)

## 2022-03-01 LAB — CBC WITH DIFFERENTIAL/PLATELET
Basophils Absolute: 0 10*3/uL (ref 0.0–0.2)
Basos: 1 %
EOS (ABSOLUTE): 0.1 10*3/uL (ref 0.0–0.4)
Eos: 2 %
Hematocrit: 42.4 % (ref 34.0–46.6)
Hemoglobin: 13.9 g/dL (ref 11.1–15.9)
Immature Grans (Abs): 0 10*3/uL (ref 0.0–0.1)
Immature Granulocytes: 0 %
Lymphocytes Absolute: 1.5 10*3/uL (ref 0.7–3.1)
Lymphs: 36 %
MCH: 31.2 pg (ref 26.6–33.0)
MCHC: 32.8 g/dL (ref 31.5–35.7)
MCV: 95 fL (ref 79–97)
Monocytes Absolute: 0.3 10*3/uL (ref 0.1–0.9)
Monocytes: 7 %
Neutrophils Absolute: 2.3 10*3/uL (ref 1.4–7.0)
Neutrophils: 54 %
Platelets: 210 10*3/uL (ref 150–450)
RBC: 4.45 x10E6/uL (ref 3.77–5.28)
RDW: 12.8 % (ref 11.7–15.4)
WBC: 4.3 10*3/uL (ref 3.4–10.8)

## 2022-03-01 LAB — VITAMIN B12: Vitamin B-12: 493 pg/mL (ref 232–1245)

## 2022-03-01 LAB — VITAMIN D 25 HYDROXY (VIT D DEFICIENCY, FRACTURES): Vit D, 25-Hydroxy: 32.3 ng/mL (ref 30.0–100.0)

## 2022-03-01 LAB — TSH RFX ON ABNORMAL TO FREE T4: TSH: 1.53 u[IU]/mL (ref 0.450–4.500)

## 2022-03-01 LAB — LIPID PANEL
Chol/HDL Ratio: 2.7 ratio (ref 0.0–4.4)
Cholesterol, Total: 218 mg/dL — ABNORMAL HIGH (ref 100–199)
HDL: 82 mg/dL (ref >39)
LDL Chol Calc (NIH): 125 mg/dL — ABNORMAL HIGH (ref 0–99)
Triglycerides: 64 mg/dL (ref 0–149)
VLDL Cholesterol Cal: 11 mg/dL (ref 5–40)

## 2022-03-01 LAB — HEMOGLOBIN A1C
Est. average glucose Bld gHb Est-mCnc: 114 mg/dL
Hgb A1c MFr Bld: 5.6 % (ref 4.8–5.6)

## 2022-12-16 IMAGING — MG MM DIGITAL SCREENING BILAT W/ TOMO AND CAD
8 series · 9 of 24 positions shown · non-contrast
Comparison: Previous exam(s).

CLINICAL DATA: Screening.

EXAM:
DIGITAL SCREENING BILATERAL MAMMOGRAM WITH TOMOSYNTHESIS AND CAD
TECHNIQUE: Bilateral screening digital craniocaudal and mediolateral oblique
mammograms were obtained. Bilateral screening digital breast
tomosynthesis was performed. The images were evaluated with
computer-aided detection.

[R MLO synth-2D]
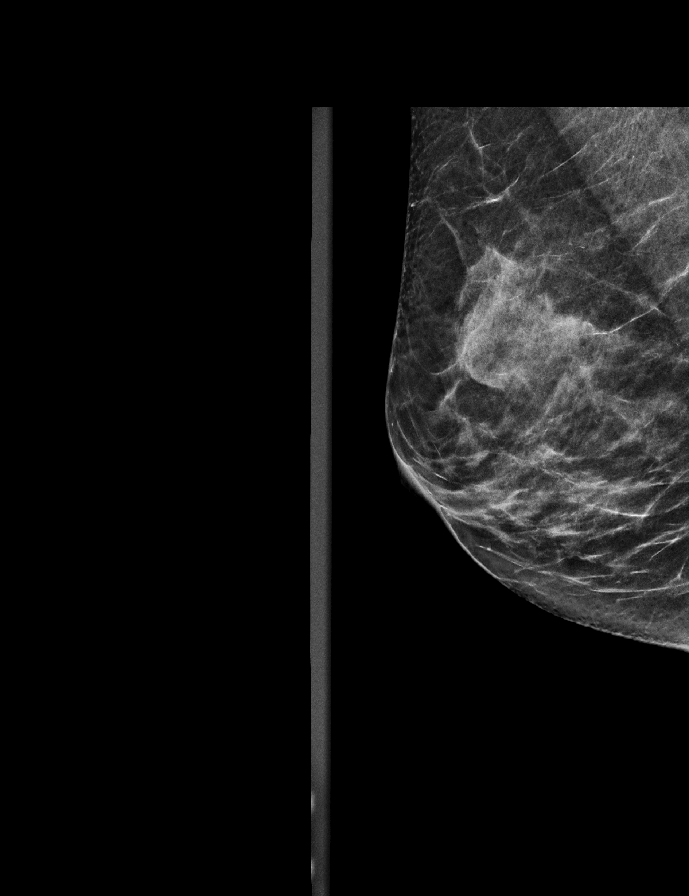

[L CC synth-2D]
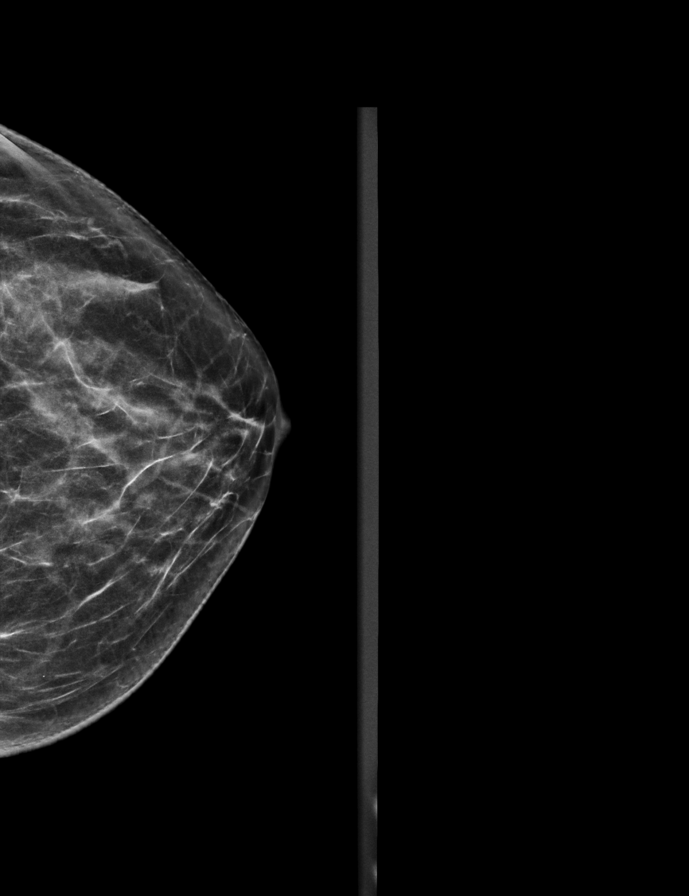

[L MLO synth-2D]
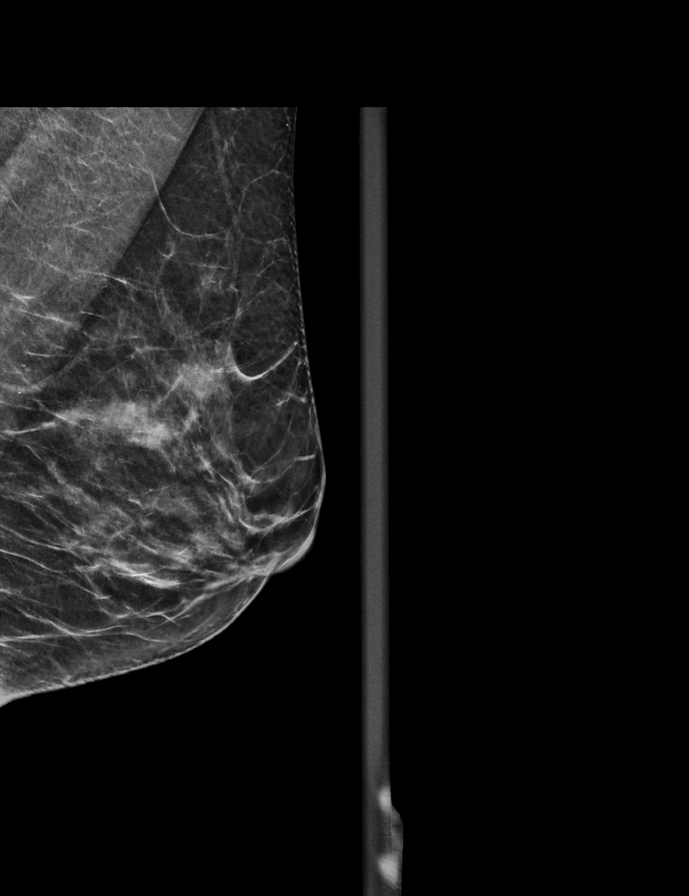

[R CC synth-2D]
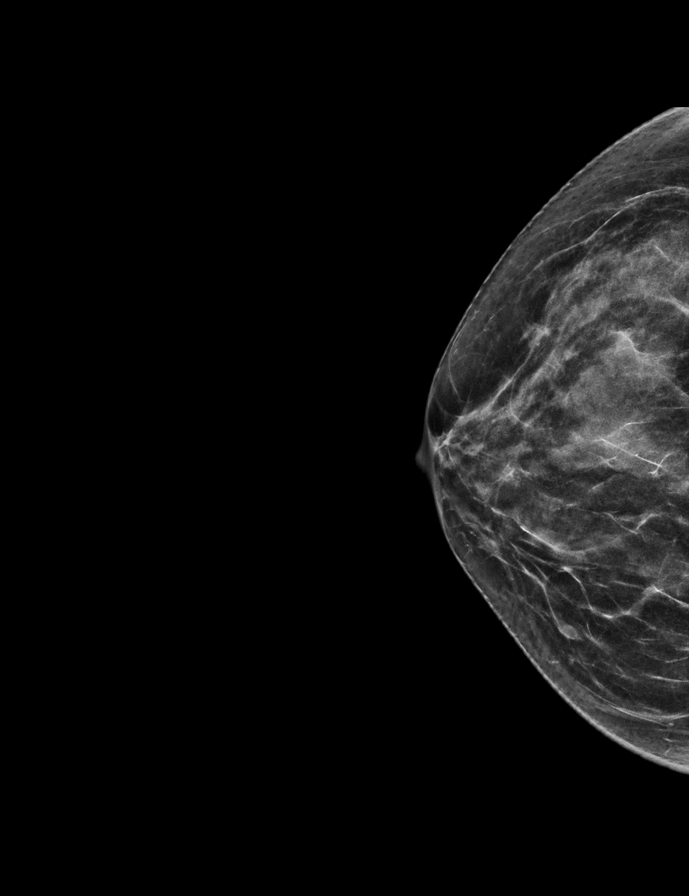

[R CC tomo · 2 of 51 frames shown]
[frame 17/51]
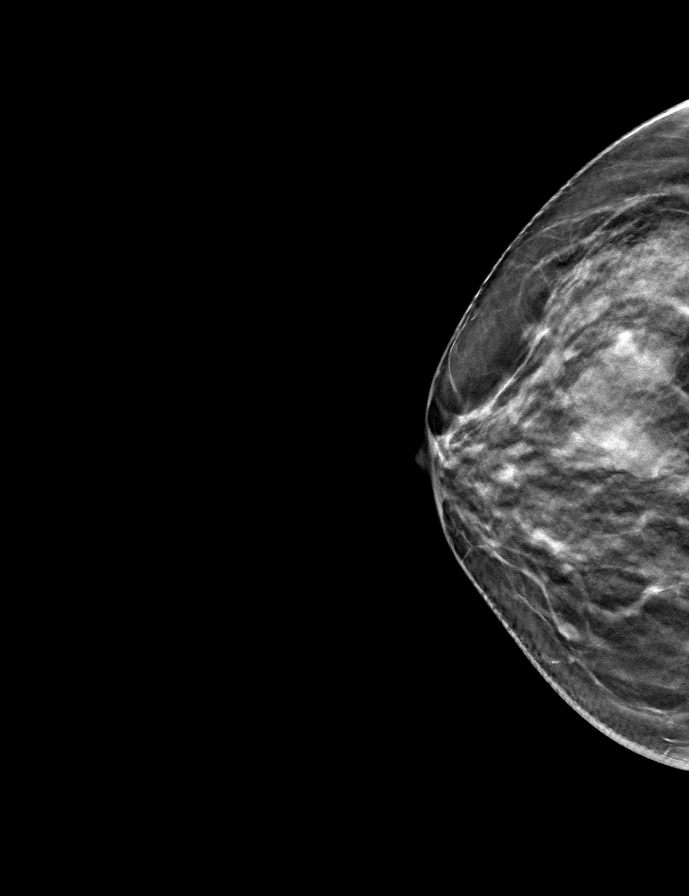
[frame 26/51]
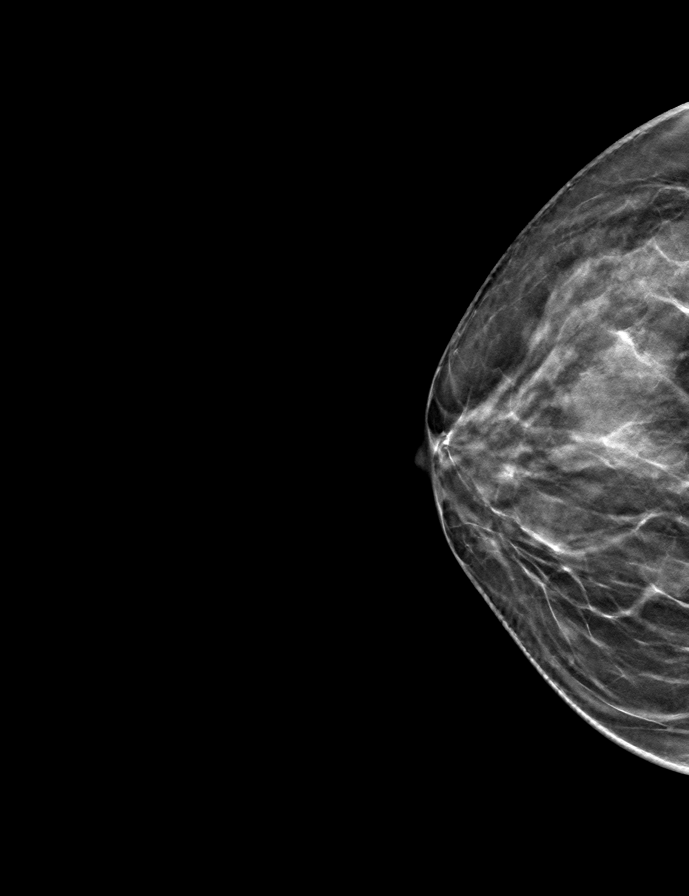

[R MLO tomo · tomo slice 27/53.0]
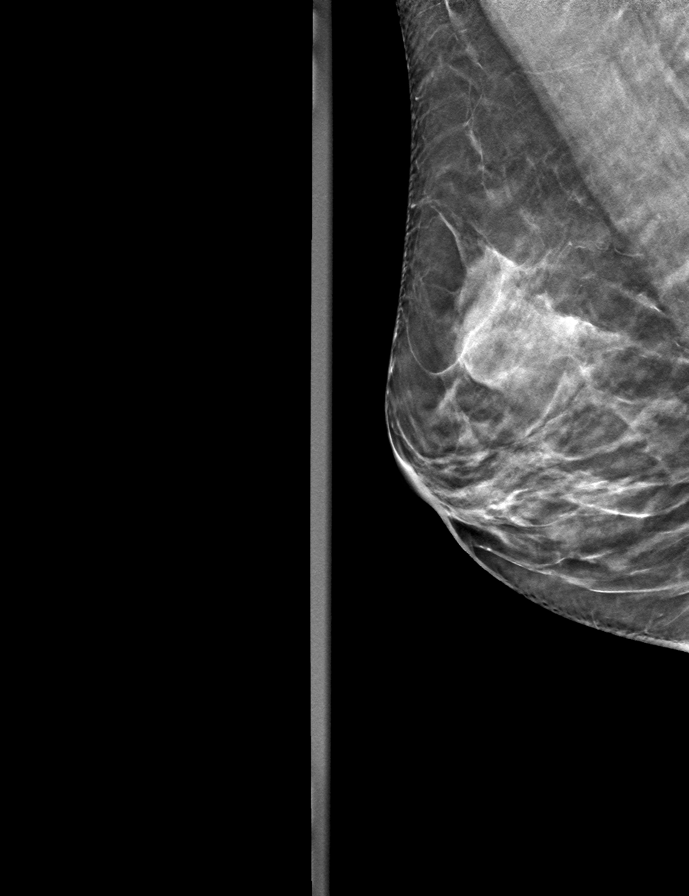

[L MLO tomo · tomo slice 29/58.0]
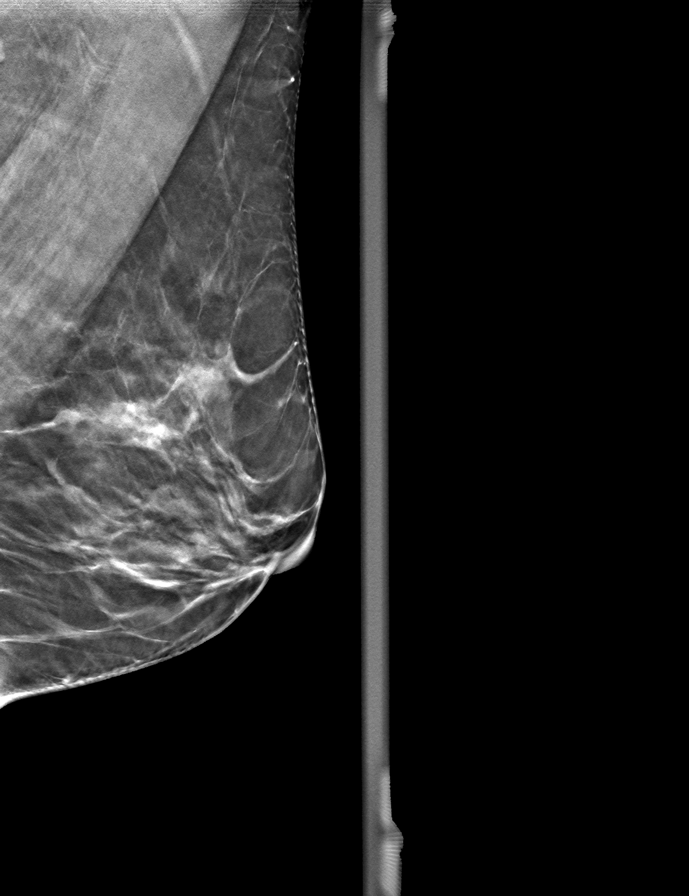

[L CC tomo · tomo slice 29/56.0]
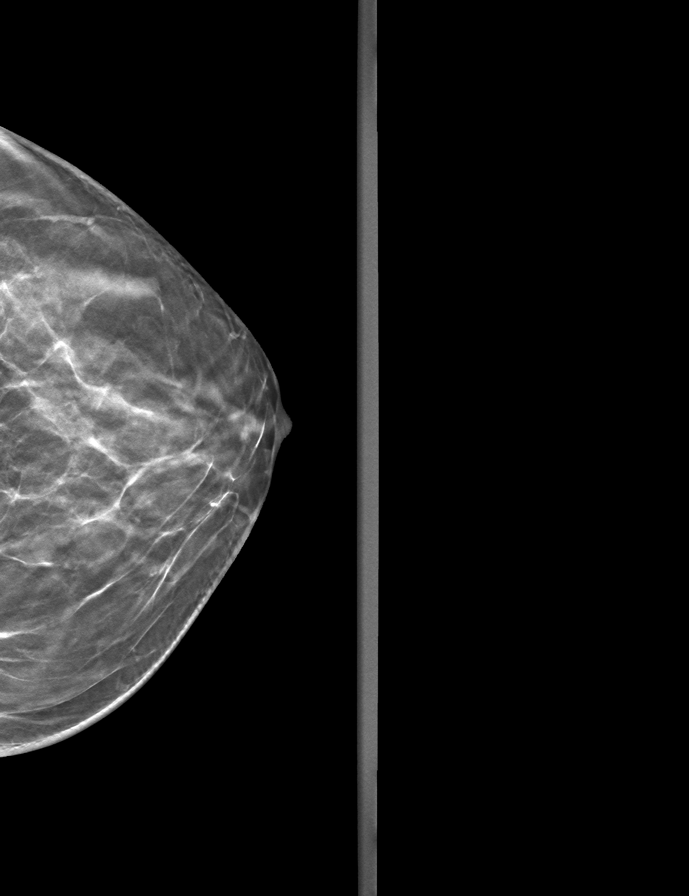

[9 of 24 positions shown; findings below may reference images not displayed]

ACR Breast Density Category c: The breast tissue is heterogeneously
dense, which may obscure small masses.
FINDINGS: There are no findings suspicious for malignancy.
IMPRESSION: No mammographic evidence of malignancy. A result letter of this
screening mammogram will be mailed directly to the patient.

RECOMMENDATION:
Screening mammogram in one year. (Code:Q3-W-BC3)

BI-RADS CATEGORY  1: Negative.

## 2022-12-19 ENCOUNTER — Encounter (HOSPITAL_BASED_OUTPATIENT_CLINIC_OR_DEPARTMENT_OTHER): Payer: Self-pay | Admitting: Family Medicine

## 2022-12-19 ENCOUNTER — Ambulatory Visit (HOSPITAL_BASED_OUTPATIENT_CLINIC_OR_DEPARTMENT_OTHER): Payer: 59 | Admitting: Family Medicine

## 2022-12-19 VITALS — BP 117/83 | HR 62 | Ht 65.0 in | Wt 130.0 lb

## 2022-12-19 DIAGNOSIS — R1310 Dysphagia, unspecified: Secondary | ICD-10-CM

## 2022-12-19 DIAGNOSIS — Z1329 Encounter for screening for other suspected endocrine disorder: Secondary | ICD-10-CM

## 2022-12-19 NOTE — Progress Notes (Signed)
Established Patient Office Visit  Subjective   Patient ID: Morgan Flynn, female    DOB: [birth year]  Age: 57 y.o. MRN: [id number]  Chief Complaint  Patient presents with   Sore Throat    Pt here for having tightness in her throat and difficulty swallowing, she stated this started a couple of weeks ago    HPI Presents today for an acute visit with complaint of "its hard to swallow". Worse with warm liquids. Not painful. Hard to swallow.  Symptoms have been present  for months, worse 2 weeks ago.  Associated symptoms include: nausea that lasted for 3 days and resolved after going out to eat.  Pertinent negatives: no fever or chills, no chest pain or shortness of breath.  Pain severity: 0/10, it is uncomfortable.  Treatments tried include : ibuprofen  Treatment effective : not effective Sick contacts : n/a Family history of thyroid enlargement.    Review of Systems  Constitutional:  Negative for chills and fever.  HENT:  Positive for sore throat (difficult swallowing).   Respiratory:  Negative for shortness of breath.   Cardiovascular:  Negative for chest pain.  Gastrointestinal:  Negative for abdominal pain, nausea and vomiting.  Musculoskeletal:  Negative for back pain.  Neurological:  Negative for dizziness and headaches.      Objective:     BP 117/83 (BP Location: Left Arm, Patient Position: Sitting, Cuff Size: Normal)   Pulse 62   Ht 5' 5"$  (1.651 m)   Wt 130 lb (59 kg)   SpO2 100%   BMI 21.63 kg/m  BP Readings from Last 3 Encounters:  12/19/22 117/83  02/23/22 124/88  05/31/21 114/72      Physical Exam Vitals and nursing note reviewed.  Constitutional:      General: She is not in acute distress.    Appearance: She is well-developed and normal weight.  HENT:     Mouth/Throat:     Mouth: Mucous membranes are moist.     Pharynx: Oropharynx is clear. Uvula midline. No pharyngeal swelling, oropharyngeal exudate, posterior oropharyngeal erythema or  uvula swelling.     Comments: No foreign body.  Neck:     Thyroid: Thyroid tenderness present.     Trachea: Trachea normal.  Cardiovascular:     Rate and Rhythm: Regular rhythm.     Heart sounds: Normal heart sounds.  Pulmonary:     Effort: Pulmonary effort is normal.     Breath sounds: Normal breath sounds.  Musculoskeletal:     Cervical back: Normal range of motion. Tenderness (in center of neck) present. No signs of trauma or rigidity. Normal range of motion.  Lymphadenopathy:     Cervical: No cervical adenopathy.     Right cervical: No superficial cervical adenopathy.    Left cervical: No superficial cervical adenopathy.  Skin:    General: Skin is warm and dry.     Capillary Refill: Capillary refill takes less than 2 seconds.  Neurological:     General: No focal deficit present.     Mental Status: She is alert. Mental status is at baseline.  Psychiatric:        Mood and Affect: Mood normal.        Behavior: Behavior normal.        Thought Content: Thought content normal.        Judgment: Judgment normal.     No results found for any visits on 12/19/22.    The 10-year ASCVD risk score (Arnett DK,  et al., 2019) is: 1.8%    Assessment & Plan:   Problem List Items Addressed This Visit     Thyroid disorder screening - Primary   Relevant Orders   TSH + free T4   US THYROID   Difficulty swallowing liquids    Reports difficulty with swallowing, worse in the past 2 weeks.  Symptoms are worse with swallowing warm liquids.  Family history of thyroid enlargement.  Reports nausea which lasted 3 days after going out to eat, unsure if this is related.  Denies fever, chills, chest pain, shortness of breath.  Pharynx unremarkable upon assessment.  Tenderness in thyroid region of neck.  No obvious nodules palpated.  Ultrasound of neck/thyroid ordered for further assessment.  TSH, T4 ordered.      Relevant Orders   US THYROID   Agrees with plan of care discussed.  Questions  answered.  If thyroid ultrasound negative, may benefit from ENT specialist referral.  History of h pylori infection in past.  .  Return in about 4 weeks (around 01/16/2023) for swallowing difficulty .    Chalmers Guest, FNP

## 2022-12-19 NOTE — Assessment & Plan Note (Addendum)
Reports difficulty with swallowing, worse in the past 2 weeks.  Symptoms are worse with swallowing warm liquids.  Family history of thyroid enlargement.  Reports nausea which lasted 3 days after going out to eat, unsure if this is related.  Denies fever, chills, chest pain, shortness of breath.  Pharynx unremarkable upon assessment.  Tenderness in thyroid region of neck.  No obvious nodules palpated.  Ultrasound of neck/thyroid ordered for further assessment.  TSH, T4 ordered.

## 2022-12-20 LAB — TSH+FREE T4
Free T4: 1.38 ng/dL (ref 0.82–1.77)
TSH: 0.901 u[IU]/mL (ref 0.450–4.500)

## 2023-01-10 ENCOUNTER — Encounter (HOSPITAL_BASED_OUTPATIENT_CLINIC_OR_DEPARTMENT_OTHER): Payer: Self-pay

## 2023-01-16 ENCOUNTER — Ambulatory Visit
Admission: RE | Admit: 2023-01-16 | Discharge: 2023-01-16 | Disposition: A | Discharge status: Home / Self Care | Payer: Managed Care, Other (non HMO) | Source: Ambulatory Visit | Attending: Family Medicine | Admitting: Family Medicine

## 2023-01-19 ENCOUNTER — Encounter (HOSPITAL_BASED_OUTPATIENT_CLINIC_OR_DEPARTMENT_OTHER): Payer: Self-pay | Admitting: Family Medicine

## 2023-01-19 ENCOUNTER — Ambulatory Visit (HOSPITAL_BASED_OUTPATIENT_CLINIC_OR_DEPARTMENT_OTHER): Payer: Managed Care, Other (non HMO) | Admitting: Family Medicine

## 2023-01-19 VITALS — BP 110/76 | HR 67 | Ht 65.0 in | Wt 130.0 lb

## 2023-01-19 DIAGNOSIS — R1310 Dysphagia, unspecified: Secondary | ICD-10-CM | POA: Diagnosis not present

## 2023-01-19 NOTE — Progress Notes (Signed)
    Procedures performed today:    None.  Independent interpretation of notes and tests performed by another provider:   None.  Brief History, Exam, Impression, and Recommendations:    BP 110/76 (BP Location: Left Arm, Patient Position: Sitting, Cuff Size: Normal)   Pulse 67   Ht  (1.651 m)   Wt 130 lb (59 kg)   SpO2 100%   BMI 21.63 kg/m   Difficulty swallowing liquids At last office visit, labs and imaging were ordered including thyroid testing and ultrasound of thyroid.  These were unremarkable with normal thyroid labs as well as no abnormality of thyroid gland or surrounding structures observed on ultrasound.  She does continue to have some mild difficulty with swallowing, however has improved since last visit.  We did discuss potential referral to specialist for further evaluation which was also discussed at prior office visit, specifically would proceed with referral to ENT.  At this time, patient wishes to hold off on this for now.  Advised that if she does change her mind, she can reach out to clinic and we can place referral at any time.  If there is a specific ENT office that she would like referral to, advised to let us know which office at time of request and we can do our best to accommodate  Return in about 2 months (around 03/21/2023) for swallowing difficulty.   ___________________________________________ Francee Setzer de [place], MD, ABFM, CAQSM Primary Care and Sports Medicine [facility]

## 2023-02-19 NOTE — Assessment & Plan Note (Signed)
At last office visit, labs and imaging were ordered including thyroid testing and ultrasound of thyroid.  These were unremarkable with normal thyroid labs as well as no abnormality of thyroid gland or surrounding structures observed on ultrasound.  She does continue to have some mild difficulty with swallowing, however has improved since last visit.  We did discuss potential referral to specialist for further evaluation which was also discussed at prior office visit, specifically would proceed with referral to ENT.  At this time, patient wishes to hold off on this for now.  Advised that if she does change her mind, she can reach out to clinic and we can place referral at any time.  If there is a specific ENT office that she would like referral to, advised to let us know which office at time of request and we can do our best to accommodate

## 2023-02-20 ENCOUNTER — Ambulatory Visit (HOSPITAL_BASED_OUTPATIENT_CLINIC_OR_DEPARTMENT_OTHER): Payer: 59

## 2023-02-27 ENCOUNTER — Encounter (HOSPITAL_BASED_OUTPATIENT_CLINIC_OR_DEPARTMENT_OTHER): Payer: 59 | Admitting: Family Medicine

## 2023-03-22 ENCOUNTER — Ambulatory Visit (HOSPITAL_BASED_OUTPATIENT_CLINIC_OR_DEPARTMENT_OTHER): Payer: BC Managed Care – PPO

## 2023-03-22 ENCOUNTER — Other Ambulatory Visit (HOSPITAL_BASED_OUTPATIENT_CLINIC_OR_DEPARTMENT_OTHER): Payer: Self-pay

## 2023-03-22 DIAGNOSIS — Z0000 Encounter for general adult medical examination without abnormal findings: Secondary | ICD-10-CM | POA: Diagnosis not present

## 2023-03-23 LAB — CBC WITH DIFFERENTIAL/PLATELET
Basophils Absolute: 0 10*3/uL (ref 0.0–0.2)
Basos: 1 %
EOS (ABSOLUTE): 0.1 10*3/uL (ref 0.0–0.4)
Eos: 2 %
Hematocrit: 39.3 % (ref 34.0–46.6)
Hemoglobin: 12.8 g/dL (ref 11.1–15.9)
Immature Grans (Abs): 0 10*3/uL (ref 0.0–0.1)
Immature Granulocytes: 0 %
Lymphocytes Absolute: 1.3 10*3/uL (ref 0.7–3.1)
Lymphs: 34 %
MCH: 31.4 pg (ref 26.6–33.0)
MCHC: 32.6 g/dL (ref 31.5–35.7)
MCV: 96 fL (ref 79–97)
Monocytes Absolute: 0.3 10*3/uL (ref 0.1–0.9)
Monocytes: 9 %
Neutrophils Absolute: 2.1 10*3/uL (ref 1.4–7.0)
Neutrophils: 54 %
Platelets: 216 10*3/uL (ref 150–450)
RBC: 4.08 x10E6/uL (ref 3.77–5.28)
RDW: 12.5 % (ref 11.7–15.4)
WBC: 3.9 10*3/uL (ref 3.4–10.8)

## 2023-03-23 LAB — LIPID PANEL
Chol/HDL Ratio: 2.9 ratio (ref 0.0–4.4)
Cholesterol, Total: 224 mg/dL — ABNORMAL HIGH (ref 100–199)
HDL: 77 mg/dL (ref >39)
LDL Chol Calc (NIH): 140 mg/dL — ABNORMAL HIGH (ref 0–99)
Triglycerides: 42 mg/dL (ref 0–149)
VLDL Cholesterol Cal: 7 mg/dL (ref 5–40)

## 2023-03-23 LAB — COMPREHENSIVE METABOLIC PANEL
ALT: 12 IU/L (ref 0–32)
AST: 18 IU/L (ref 0–40)
Albumin/Globulin Ratio: 1.8 (ref 1.2–2.2)
Albumin: 4.3 g/dL (ref 3.8–4.9)
Alkaline Phosphatase: 65 IU/L (ref 44–121)
BUN/Creatinine Ratio: 17 (ref 9–23)
BUN: 13 mg/dL (ref 6–24)
Bilirubin Total: 0.4 mg/dL (ref 0.0–1.2)
CO2: 24 mmol/L (ref 20–29)
Calcium: 9.4 mg/dL (ref 8.7–10.2)
Chloride: 104 mmol/L (ref 96–106)
Creatinine, Ser: 0.76 mg/dL (ref 0.57–1.00)
Globulin, Total: 2.4 g/dL (ref 1.5–4.5)
Glucose: 111 mg/dL — ABNORMAL HIGH (ref 70–99)
Potassium: 5.8 mmol/L — ABNORMAL HIGH (ref 3.5–5.2)
Sodium: 140 mmol/L (ref 134–144)
Total Protein: 6.7 g/dL (ref 6.0–8.5)
eGFR: 91 mL/min/{1.73_m2} (ref >59)

## 2023-03-23 LAB — TSH: TSH: 1.16 u[IU]/mL (ref 0.450–4.500)

## 2023-03-23 LAB — VITAMIN B12: Vitamin B-12: 443 pg/mL (ref 232–1245)

## 2023-03-23 LAB — VITAMIN D 25 HYDROXY (VIT D DEFICIENCY, FRACTURES): Vit D, 25-Hydroxy: 30.1 ng/mL (ref 30.0–100.0)

## 2023-03-23 LAB — HEMOGLOBIN A1C
Est. average glucose Bld gHb Est-mCnc: 114 mg/dL
Hgb A1c MFr Bld: 5.6 % (ref 4.8–5.6)

## 2023-03-29 ENCOUNTER — Ambulatory Visit (INDEPENDENT_AMBULATORY_CARE_PROVIDER_SITE_OTHER): Payer: BC Managed Care – PPO | Admitting: Family Medicine

## 2023-03-29 ENCOUNTER — Encounter (HOSPITAL_BASED_OUTPATIENT_CLINIC_OR_DEPARTMENT_OTHER): Payer: Self-pay | Admitting: Family Medicine

## 2023-03-29 VITALS — BP 105/72 | HR 67 | Temp 97.6°F | Ht 65.0 in | Wt 130.0 lb

## 2023-03-29 DIAGNOSIS — Z0000 Encounter for general adult medical examination without abnormal findings: Secondary | ICD-10-CM | POA: Diagnosis not present

## 2023-03-29 NOTE — Assessment & Plan Note (Addendum)
Routine HCM labs ordered. HCM reviewed/discussed. Anticipatory guidance regarding healthy weight, lifestyle and choices given. Recommend healthy diet.  Recommend approximately 150 minutes/week of moderate intensity exercise Recommend regular dental and vision exams Always use seatbelt/lap and shoulder restraints Recommend using smoke alarms and checking batteries at least twice a year Recommend using sunscreen when outside Discussed colon cancer screening recommendations, options.  Patient reports being UTD - will request records Discussed immunization recommendations

## 2023-03-29 NOTE — Progress Notes (Signed)
Subjective:    CC: Annual Physical Exam  HPI:  Morgan Flynn is a 57 y.o. presenting for annual physical  I reviewed the past medical history, family history, social history, surgical history, and allergies today and no changes were needed.  Please see the problem list section below in epic for further details.  Past Medical History: Past Medical History:  Diagnosis Date   Raynaud's disease with gangrene (HCC)    Past Surgical History: Past Surgical History:  Procedure Laterality Date   APPENDECTOMY     age 69   CERVICAL BIOPSY  W/ LOOP ELECTRODE EXCISION  2000   CESAREAN SECTION     Social History: Social History   Socioeconomic History   Marital status: Divorced    Spouse name: Not on file   Number of children: Not on file   Years of education: Not on file   Highest education level: Not on file  Occupational History   Not on file  Tobacco Use   Smoking status: Never    Passive exposure: Never   Smokeless tobacco: Never  Vaping Use   Vaping Use: Never used  Substance and Sexual Activity   Alcohol use: Yes    Alcohol/week: 1.0 standard drink of alcohol    Types: 1 Glasses of wine per week    Comment: rarely   Drug use: Never   Sexual activity: Yes    Birth control/protection: None  Other Topics Concern   Not on file  Social History Narrative   Not on file   Social Determinants of Health   Financial Resource Strain: Not on file  Food Insecurity: Not on file  Transportation Needs: Not on file  Physical Activity: Not on file  Stress: Not on file  Social Connections: Not on file   Family History: Family History  Problem Relation Age of Onset   Heart Problems Mother    Other Mother        Died from Covid complications   Other Father 71       Died from Covid Complication   Breast cancer Sister    Ovarian cancer Sister    Healthy Son    Allergies: Allergies  Allergen Reactions   Black Walnut Pollen Cough   Molds & Smuts    Dust Mite  Extract Rash   Medications: See med rec.  Review of Systems: No headache, visual changes, nausea, vomiting, diarrhea, constipation, dizziness, abdominal pain, skin rash, fevers, chills, night sweats, swollen lymph nodes, weight loss, chest pain, body aches, joint swelling, muscle aches, shortness of breath, mood changes, visual or auditory hallucinations.  Objective:    BP 105/72 (BP Location: Right Arm, Patient Position: Sitting, Cuff Size: Normal)   Pulse 67   Temp 97.6 F (36.4 C) (Oral)   Ht 5\' 5"  (1.651 m)   Wt 130 lb (59 kg)   SpO2 99%   BMI 21.63 kg/m   General: Well Developed, well nourished, and in no acute distress.  Neuro: Alert and oriented x3, extra-ocular muscles intact, sensation grossly intact. Cranial nerves II through XII are intact, motor, sensory, and coordinative functions are all intact. HEENT: Normocephalic, atraumatic, pupils equal round reactive to light, neck supple, no masses, no lymphadenopathy, thyroid nonpalpable. Oropharynx, nasopharynx, external ear canals are unremarkable. Skin: Warm and dry, no rashes noted. Cardiac: Regular rate and rhythm, no murmurs rubs or gallops. Respiratory: Clear to auscultation bilaterally. Not using accessory muscles, speaking in full sentences. Abdominal: Soft, nontender, nondistended, positive bowel sounds, no masses,  no organomegaly. Musculoskeletal: Shoulder, elbow, wrist, hip, knee, ankle stable, and with full range of motion.  Impression and Recommendations:    Wellness examination Routine HCM labs ordered. HCM reviewed/discussed. Anticipatory guidance regarding healthy weight, lifestyle and choices given. Recommend healthy diet.  Recommend approximately 150 minutes/week of moderate intensity exercise Recommend regular dental and vision exams Always use seatbelt/lap and shoulder restraints Recommend using smoke alarms and checking batteries at least twice a year Recommend using sunscreen when outside Discussed  colon cancer screening recommendations, options.  Patient reports being UTD - will request records Discussed immunization recommendations  Discussed slightly elevated potassium. Advised on recheck today for monitoring, patient declines.  Return in about 1 year (around 03/28/2024) for CPE.   ___________________________________________ Marsha Hillman de [place], MD, ABFM, [facility] Primary Care and Sports Medicine [facility]

## 2023-03-30 ENCOUNTER — Encounter (HOSPITAL_BASED_OUTPATIENT_CLINIC_OR_DEPARTMENT_OTHER): Payer: Self-pay | Admitting: Family Medicine

## 2023-05-07 ENCOUNTER — Ambulatory Visit (HOSPITAL_BASED_OUTPATIENT_CLINIC_OR_DEPARTMENT_OTHER): Payer: Managed Care, Other (non HMO) | Admitting: Family Medicine

## 2023-05-15 ENCOUNTER — Encounter (HOSPITAL_BASED_OUTPATIENT_CLINIC_OR_DEPARTMENT_OTHER): Payer: Self-pay | Admitting: *Deleted

## 2023-05-15 ENCOUNTER — Telehealth (HOSPITAL_BASED_OUTPATIENT_CLINIC_OR_DEPARTMENT_OTHER): Payer: Self-pay | Admitting: *Deleted

## 2023-05-15 NOTE — Telephone Encounter (Signed)
LVM to see if patient had colon cancer screening. Mychart message sent

## 2023-07-24 DIAGNOSIS — H04201 Unspecified epiphora, right lacrimal gland: Secondary | ICD-10-CM | POA: Diagnosis not present

## 2023-07-24 DIAGNOSIS — H04123 Dry eye syndrome of bilateral lacrimal glands: Secondary | ICD-10-CM | POA: Diagnosis not present

## 2023-07-24 DIAGNOSIS — H1045 Other chronic allergic conjunctivitis: Secondary | ICD-10-CM | POA: Diagnosis not present

## 2023-08-22 DIAGNOSIS — H04201 Unspecified epiphora, right lacrimal gland: Secondary | ICD-10-CM | POA: Diagnosis not present

## 2023-08-22 DIAGNOSIS — H1045 Other chronic allergic conjunctivitis: Secondary | ICD-10-CM | POA: Diagnosis not present

## 2023-11-29 ENCOUNTER — Other Ambulatory Visit: Payer: Self-pay | Admitting: Obstetrics and Gynecology

## 2023-11-29 DIAGNOSIS — Z1231 Encounter for screening mammogram for malignant neoplasm of breast: Secondary | ICD-10-CM

## 2023-12-11 ENCOUNTER — Ambulatory Visit: Payer: BC Managed Care – PPO

## 2023-12-11 ENCOUNTER — Ambulatory Visit
Admission: RE | Admit: 2023-12-11 | Discharge: 2023-12-11 | Disposition: A | Discharge status: Home / Self Care | Payer: BC Managed Care – PPO | Source: Ambulatory Visit | Attending: Obstetrics and Gynecology | Admitting: Obstetrics and Gynecology

## 2023-12-11 DIAGNOSIS — Z1231 Encounter for screening mammogram for malignant neoplasm of breast: Secondary | ICD-10-CM

## 2024-01-01 DIAGNOSIS — Z124 Encounter for screening for malignant neoplasm of cervix: Secondary | ICD-10-CM | POA: Diagnosis not present

## 2024-01-01 DIAGNOSIS — Z01419 Encounter for gynecological examination (general) (routine) without abnormal findings: Secondary | ICD-10-CM | POA: Diagnosis not present

## 2024-01-11 DIAGNOSIS — D2261 Melanocytic nevi of right upper limb, including shoulder: Secondary | ICD-10-CM | POA: Diagnosis not present

## 2024-01-11 DIAGNOSIS — D225 Melanocytic nevi of trunk: Secondary | ICD-10-CM | POA: Diagnosis not present

## 2024-01-11 DIAGNOSIS — D235 Other benign neoplasm of skin of trunk: Secondary | ICD-10-CM | POA: Diagnosis not present

## 2024-01-11 DIAGNOSIS — D2262 Melanocytic nevi of left upper limb, including shoulder: Secondary | ICD-10-CM | POA: Diagnosis not present

## 2024-03-31 ENCOUNTER — Encounter (HOSPITAL_BASED_OUTPATIENT_CLINIC_OR_DEPARTMENT_OTHER): Payer: BC Managed Care – PPO | Admitting: Family Medicine

## 2024-04-30 DIAGNOSIS — R208 Other disturbances of skin sensation: Secondary | ICD-10-CM | POA: Diagnosis not present

## 2024-04-30 DIAGNOSIS — Z0000 Encounter for general adult medical examination without abnormal findings: Secondary | ICD-10-CM | POA: Diagnosis not present

## 2024-04-30 DIAGNOSIS — H9313 Tinnitus, bilateral: Secondary | ICD-10-CM | POA: Diagnosis not present

## 2024-09-30 DIAGNOSIS — R42 Dizziness and giddiness: Secondary | ICD-10-CM | POA: Diagnosis not present

## 2024-09-30 DIAGNOSIS — E559 Vitamin D deficiency, unspecified: Secondary | ICD-10-CM | POA: Diagnosis not present

## 2024-09-30 DIAGNOSIS — E782 Mixed hyperlipidemia: Secondary | ICD-10-CM | POA: Diagnosis not present

## 2024-09-30 DIAGNOSIS — R002 Palpitations: Secondary | ICD-10-CM | POA: Diagnosis not present

## 2024-10-31 ENCOUNTER — Ambulatory Visit: Admitting: Cardiology

## 2024-11-04 ENCOUNTER — Ambulatory Visit

## 2024-11-04 ENCOUNTER — Encounter: Payer: Self-pay | Admitting: Student in an Organized Health Care Education/Training Program

## 2024-11-04 ENCOUNTER — Ambulatory Visit
Attending: Student in an Organized Health Care Education/Training Program | Admitting: Student in an Organized Health Care Education/Training Program

## 2024-11-04 VITALS — BP 124/84 | HR 63 | Resp 16 | Ht 65.0 in | Wt 141.6 lb

## 2024-11-04 DIAGNOSIS — R55 Syncope and collapse: Secondary | ICD-10-CM

## 2024-11-04 DIAGNOSIS — Z13220 Encounter for screening for lipoid disorders: Secondary | ICD-10-CM | POA: Diagnosis not present

## 2024-11-04 DIAGNOSIS — R002 Palpitations: Secondary | ICD-10-CM

## 2024-11-04 DIAGNOSIS — Z8249 Family history of ischemic heart disease and other diseases of the circulatory system: Secondary | ICD-10-CM

## 2024-11-04 NOTE — Progress Notes (Unsigned)
 Enrolled patient for a 14 day Zio XT  monitor to be mailed to patients home

## 2024-11-04 NOTE — Progress Notes (Signed)
 " Cardiology Office Note:   Date:  11/04/2024  ID:  [name], DOB [birth year], MRN [id number] PCP: de [place], Raymond J, MD  [facility] Providers Cardiologist:  Georganna Archer, MD { Chief Complaint:  Chief Complaint  Patient presents with   Palpitations   Dizziness   New Patient (Initial Visit)      History of Present Illness:   Morgan Flynn is a 59 y.o. female with a PMH of Raynaud's syndrome who presents as a new patient referral for the evaluation of palpitations/dizziness.  The patient states that she has had a longstanding history of presyncope in the setting of standing.  She states that she has had several instances where she was stand up and her blood pressure would drop down as low with systolics in the 50s and she would become lightheaded.  However, more recently she was on a trip to [place] when she had an episode when she stood up on the plane and became lightheaded but also developed palpitations.  This started in October 2025.  Since this time she has had intermittent palpitations and lightheadedness that have improved with behavior modification of standing up slowly, but are still noticeable.  She has a family history of CHF in her mother and so she wants to be evaluated to make sure that she has no genetic predisposition to cardiovascular disease.  She is otherwise very healthy and denies all other symptoms including chest pain, SOB, PND, orthopnea, swelling, and true syncope.  She denies tobacco, alcohol, illicit drug use.  She has an adult son who is a teacher, early years/pre and is healthy.   Past Medical History:  Diagnosis Date   Raynaud's disease with gangrene (HCC)      Studies Reviewed:    EKG: Outside EKG uploaded showing NSR           Risk Assessment/Calculations:              Physical Exam:     VS:  BP 124/84 (BP Location: Left Arm, Patient Position: Sitting, Cuff Size: Normal)   Pulse 63   Resp 16   Ht 5' 5 (1.651 m)   Wt  141 lb 9.6 oz (64.2 kg)   SpO2 96%   BMI 23.56 kg/m      Wt Readings from Last 3 Encounters:  03/29/23 130 lb (59 kg)  01/19/23 130 lb (59 kg)  12/19/22 130 lb (59 kg)     GEN: Well nourished, well developed, in no acute distress NECK: No JVD; No carotid bruits CARDIAC: RRR, no murmurs, rubs, gallops RESPIRATORY:  Clear to auscultation without rales, wheezing or rhonchi  ABDOMEN: Soft, non-tender, non-distended, normal bowel sounds EXTREMITIES:  Warm and well perfused, no edema; No deformity, 2+ radial pulses PSYCH: Normal mood and affect   Assessment & Plan   #Orthostatic Hypotension #Palpitations - Patient's symptoms of lightheadedness upon standing with associated drop in blood pressure is fairly classic for orthostatic hypotension. - I instructed the patient to continue behavior modifications with standing slowly and staying adequately hydrated. - I think it is reasonable to get a complete echocardiogram to ensure that there is no structural cardiac disease causing her presyncope, however I think this is unlikely. - In terms of her palpitations, I suspect that these are compensatory increases in heart rate in the setting of drops in her blood pressure. - Will get a heart monitor to be sure. Complete echocardiogram 7-day heart monitor Follow-up in 3 months   #Lipid Screening -  Family history of heart disease with her mother having had CHF. - The patient states that she wants to better understand her overall cardiovascular risk. - For these reasons I will check a lipoprotein a and a CAC score. CAC score Lipoprotein a          This note was written with the assistance of a dictation microphone or AI dictation software. Please excuse any typos or grammatical errors.   Signed, Georganna Archer, MD 11/04/2024 2:34 PM    [facility]  "

## 2024-11-04 NOTE — Patient Instructions (Signed)
" ° °  Lab Work: LP(a) If you have labs (blood work) drawn today and your tests are completely normal, you will receive your results only by: MyChart Message (if you have MyChart) OR A paper copy in the mail If you have any lab test that is abnormal or we need to change your treatment, we will call you to review the results.  Testing/Procedures: ECHOCARDIOGRAM  Your physician has requested that you have an echocardiogram. Echocardiography is a painless test that uses sound waves to create images of your heart. It provides your doctor with information about the size and shape of your heart and how well your hearts chambers and valves are working. This procedure takes approximately one hour. There are no restrictions for this procedure. Please do NOT wear cologne, perfume, aftershave, or lotions (deodorant is allowed). Please arrive 15 minutes prior to your appointment time.  Please note: We ask at that you not bring children with you during ultrasound (echo/ vascular) testing. Due to room size and safety concerns, children are not allowed in the ultrasound rooms during exams. Our front office staff cannot provide observation of children in our lobby area while testing is being conducted. An adult accompanying a patient to their appointment will only be allowed in the ultrasound room at the discretion of the ultrasound technician under special circumstances. We apologize for any inconvenience.  CALCIUM SCORE   Your provider would like for you to have a Calcium Score CT. This test is painless. This is a non-contrast CT of the heat to look for calcified lesions in the coronary arteries. The cost of this is test cost $99 out of pocket and is not submitted to your insurance. The test can be performed at our [facility] in [place].  ZIO HEART MONITOR   Your physician has requested that you wear a Zio heart monitor for __7___ days. This will be mailed to your home with  instructions on how to apply the monitor and how to return it when finished. Please allow 2 weeks after returning the heart monitor before our office calls you with the results.   Follow-Up: At [facility], you and your health needs are our priority.  As part of our continuing mission to provide you with exceptional heart care, our providers are all part of one team.  This team includes your primary Cardiologist (physician) and Advanced Practice Providers or APPs (Physician Assistants and Nurse Practitioners) who all work together to provide you with the care you need, when you need it.  Your next appointment:   3 month(s)  Provider:   Georganna Archer, MD           "

## 2024-11-05 LAB — LIPOPROTEIN A (LPA): Lipoprotein (a): 9.3 nmol/L

## 2024-11-06 ENCOUNTER — Ambulatory Visit: Payer: Self-pay | Admitting: Student in an Organized Health Care Education/Training Program

## 2024-11-18 ENCOUNTER — Ambulatory Visit (HOSPITAL_COMMUNITY)
Admission: RE | Admit: 2024-11-18 | Discharge: 2024-11-18 | Disposition: A | Discharge status: Home / Self Care | Payer: Self-pay | Source: Ambulatory Visit | Attending: Cardiology | Admitting: Cardiology

## 2024-11-18 DIAGNOSIS — Z8249 Family history of ischemic heart disease and other diseases of the circulatory system: Secondary | ICD-10-CM | POA: Insufficient documentation

## 2024-11-21 DIAGNOSIS — R002 Palpitations: Secondary | ICD-10-CM | POA: Diagnosis not present

## 2024-12-04 ENCOUNTER — Ambulatory Visit (HOSPITAL_COMMUNITY)

## 2025-01-08 ENCOUNTER — Ambulatory Visit (HOSPITAL_COMMUNITY)

## 2025-01-14 ENCOUNTER — Ambulatory Visit: Admitting: Student in an Organized Health Care Education/Training Program
